# Patient Record
Sex: Female | Born: 1955
Health system: Southern US, Community
[De-identification: ages and names within clinical notes are randomized; demographics above are authoritative.]

## PROBLEM LIST (undated history)

## (undated) ENCOUNTER — Ambulatory Visit (HOSPITAL_COMMUNITY): Discharge status: Absent without leave | Payer: Commercial Managed Care - PPO

## (undated) ENCOUNTER — Ambulatory Visit (HOSPITAL_COMMUNITY): Payer: PPO | Source: Home / Self Care

## (undated) DIAGNOSIS — A599 Trichomoniasis, unspecified: Secondary | ICD-10-CM

## (undated) DIAGNOSIS — J449 Chronic obstructive pulmonary disease, unspecified: Secondary | ICD-10-CM

## (undated) HISTORY — PX: TUBAL LIGATION: SHX77

---

## 2000-08-16 ENCOUNTER — Other Ambulatory Visit: Admission: RE | Admit: 2000-08-16 | Discharge: 2000-08-16 | Payer: Self-pay | Admitting: Family Medicine

## 2001-03-26 ENCOUNTER — Emergency Department (HOSPITAL_COMMUNITY): Admission: EM | Admit: 2001-03-26 | Discharge: 2001-03-26 | Payer: Self-pay | Admitting: Emergency Medicine

## 2004-09-23 ENCOUNTER — Inpatient Hospital Stay (HOSPITAL_COMMUNITY): Admission: AD | Admit: 2004-09-23 | Discharge: 2004-09-23 | Payer: Self-pay | Admitting: Obstetrics & Gynecology

## 2005-07-31 ENCOUNTER — Emergency Department (HOSPITAL_COMMUNITY): Admission: EM | Admit: 2005-07-31 | Discharge: 2005-07-31 | Payer: Self-pay | Admitting: Emergency Medicine

## 2009-02-10 ENCOUNTER — Emergency Department (HOSPITAL_COMMUNITY): Admission: EM | Admit: 2009-02-10 | Discharge: 2009-02-10 | Payer: Self-pay | Admitting: Emergency Medicine

## 2009-10-31 ENCOUNTER — Emergency Department (HOSPITAL_COMMUNITY): Admission: EM | Admit: 2009-10-31 | Discharge: 2009-10-31 | Payer: Self-pay | Admitting: Family Medicine

## 2010-11-21 ENCOUNTER — Emergency Department (HOSPITAL_COMMUNITY)
Admission: EM | Admit: 2010-11-21 | Discharge: 2010-11-21 | Payer: Self-pay | Source: Home / Self Care | Admitting: Emergency Medicine

## 2011-01-01 ENCOUNTER — Encounter: Payer: Self-pay | Admitting: Obstetrics

## 2011-04-24 ENCOUNTER — Inpatient Hospital Stay (INDEPENDENT_AMBULATORY_CARE_PROVIDER_SITE_OTHER)
Admission: RE | Admit: 2011-04-24 | Discharge: 2011-04-24 | Disposition: A | Discharge status: Home / Self Care | Payer: Self-pay | Source: Ambulatory Visit | Attending: Family Medicine | Admitting: Family Medicine

## 2011-04-24 DIAGNOSIS — B009 Herpesviral infection, unspecified: Secondary | ICD-10-CM

## 2011-04-24 DIAGNOSIS — K029 Dental caries, unspecified: Secondary | ICD-10-CM

## 2012-11-26 ENCOUNTER — Encounter (HOSPITAL_COMMUNITY): Payer: Self-pay | Admitting: Emergency Medicine

## 2012-11-26 ENCOUNTER — Emergency Department (HOSPITAL_COMMUNITY)
Admission: EM | Admit: 2012-11-26 | Discharge: 2012-11-26 | Disposition: A | Discharge status: Home / Self Care | Payer: No Typology Code available for payment source | Attending: Emergency Medicine | Admitting: Emergency Medicine

## 2012-11-26 DIAGNOSIS — H60399 Other infective otitis externa, unspecified ear: Secondary | ICD-10-CM | POA: Insufficient documentation

## 2012-11-26 DIAGNOSIS — Y929 Unspecified place or not applicable: Secondary | ICD-10-CM | POA: Insufficient documentation

## 2012-11-26 DIAGNOSIS — Y92410 Unspecified street and highway as the place of occurrence of the external cause: Secondary | ICD-10-CM | POA: Insufficient documentation

## 2012-11-26 DIAGNOSIS — H6090 Unspecified otitis externa, unspecified ear: Secondary | ICD-10-CM

## 2012-11-26 DIAGNOSIS — Z23 Encounter for immunization: Secondary | ICD-10-CM | POA: Insufficient documentation

## 2012-11-26 DIAGNOSIS — Y939 Activity, unspecified: Secondary | ICD-10-CM | POA: Insufficient documentation

## 2012-11-26 DIAGNOSIS — W268XXA Contact with other sharp object(s), not elsewhere classified, initial encounter: Secondary | ICD-10-CM | POA: Insufficient documentation

## 2012-11-26 DIAGNOSIS — F17200 Nicotine dependence, unspecified, uncomplicated: Secondary | ICD-10-CM | POA: Insufficient documentation

## 2012-11-26 DIAGNOSIS — R51 Headache: Secondary | ICD-10-CM

## 2012-11-26 DIAGNOSIS — Y9389 Activity, other specified: Secondary | ICD-10-CM | POA: Insufficient documentation

## 2012-11-26 DIAGNOSIS — G44309 Post-traumatic headache, unspecified, not intractable: Secondary | ICD-10-CM | POA: Insufficient documentation

## 2012-11-26 MED ORDER — TETANUS-DIPHTH-ACELL PERTUSSIS 5-2.5-18.5 LF-MCG/0.5 IM SUSP
0.5000 mL | Freq: Once | INTRAMUSCULAR | Status: AC
  Administered 2012-11-26: 0.5 mL via INTRAMUSCULAR
  Filled 2012-11-26: qty 0.5

## 2012-11-26 MED ORDER — OFLOXACIN 0.3 % OT SOLN: 5.0000 [drp] | Freq: Two times a day (BID) | OTIC | Status: DC

## 2012-11-26 MED ORDER — IBUPROFEN 800 MG PO TABS: 800.0000 mg | ORAL_TABLET | Freq: Three times a day (TID) | ORAL | Status: DC

## 2012-11-26 MED ORDER — OFLOXACIN 0.3 % OT SOLN
5.0000 [drp] | Freq: Every day | OTIC | Status: DC
  Administered 2012-11-26: 5 [drp] via OTIC
  Filled 2012-11-26: qty 5
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
## 2012-11-26 NOTE — ED Notes (Signed)
Pt c/o earache that started today.  Pain 10/10.

## 2012-11-27 NOTE — ED Provider Notes (Signed)
Medical screening examination/treatment/procedure(s) were performed by non-physician practitioner and as supervising physician I was immediately available for consultation/collaboration.  Tersa Fotopoulos, MD 11/27/12 0002 

## 2012-12-18 ENCOUNTER — Emergency Department (HOSPITAL_COMMUNITY)
Admission: EM | Admit: 2012-12-18 | Discharge: 2012-12-18 | Disposition: A | Discharge status: Home / Self Care | Payer: PRIVATE HEALTH INSURANCE | Attending: Emergency Medicine | Admitting: Emergency Medicine

## 2012-12-18 ENCOUNTER — Encounter (HOSPITAL_COMMUNITY): Payer: Self-pay | Admitting: Emergency Medicine

## 2012-12-18 DIAGNOSIS — R229 Localized swelling, mass and lump, unspecified: Secondary | ICD-10-CM | POA: Insufficient documentation

## 2012-12-18 DIAGNOSIS — F17200 Nicotine dependence, unspecified, uncomplicated: Secondary | ICD-10-CM | POA: Insufficient documentation

## 2012-12-18 DIAGNOSIS — K047 Periapical abscess without sinus: Secondary | ICD-10-CM | POA: Insufficient documentation

## 2012-12-18 MED ORDER — HYDROCODONE-ACETAMINOPHEN 5-325 MG PO TABS: 1.0000 | ORAL_TABLET | Freq: Four times a day (QID) | ORAL | Status: DC | PRN

## 2012-12-18 MED ORDER — PENICILLIN V POTASSIUM 500 MG PO TABS: 500.0000 mg | ORAL_TABLET | Freq: Four times a day (QID) | ORAL | Status: DC

## 2012-12-18 NOTE — ED Provider Notes (Signed)
History     CSN: [id number]  Arrival date & time 12/18/12  1014   First MD Initiated Contact with Patient 12/18/12 1044      Chief Complaint  Patient presents with  . Dental Pain  . Facial Swelling    swelling x 2 days    (Consider location/radiation/quality/duration/timing/severity/associated sxs/prior treatment) HPI The patient presents to the emergency department with dental abscess.  Patient states that she has taken Tylenol and Advil for pain without significant relief.  Patient also states that she has been gargling with peroxide and water.  Patient, states she's had cracked teeth.  On the left lower lower part of her jaw for quite a while.  Patient denies fevers, nausea, vomiting, difficulty breathing, difficulty swallowing, or swelling in her tongue.  Patient states that she's also not experiencing any neck pain.  History reviewed. No pertinent past medical history.  Past Surgical History  Procedure Date  . Cesarean section     Family History  Problem Relation Age of Onset  . Diabetes Mother   . Diabetes Sister   . Hypertension Sister     History  Substance Use Topics  . Smoking status: Current Every Day Smoker -- 0.5 packs/day  . Smokeless tobacco: Not on file  . Alcohol Use: No    OB History    Grav Para Term Preterm Abortions TAB SAB Ect Mult Living                  Review of Systems All other systems negative except as documented in the HPI. All pertinent positives and negatives as reviewed in the HPI. Allergies  Review of patient's allergies indicates no known allergies.  Home Medications   Current Outpatient Rx  Name  Route  Sig  Dispense  Refill  . ASPIRIN 325 MG PO TABS   Oral   Take 325 mg by mouth daily.           BP 120/64  Pulse 87  Temp 98.6 F (37 C) (Oral)  Resp 18  SpO2 99%  Physical Exam  Constitutional: She is oriented to person, place, and time. She appears well-developed and well-nourished.  HENT:  Head: Normocephalic  and atraumatic.  Mouth/Throat: Oropharynx is clear and moist.    Neck: Trachea normal and normal range of motion. Neck supple. No rigidity. No edema, no erythema and normal range of motion present.    Cardiovascular: Normal rate and regular rhythm.   Pulmonary/Chest: Effort normal.  Neurological: She is alert and oriented to person, place, and time.  Skin: Skin is warm and dry. No rash noted.    ED Course  Procedures (including critical care time)  Patient be treated for a dental abscess and referred to dentistry.  Patient is advised to return here for any worsening in her condition.   MDM          Carlyle Dolly, PA-C 12/18/12 1102

## 2012-12-18 NOTE — ED Notes (Signed)
Pt reports 2 day hx of swelling and pain in l/lower jaw.

## 2012-12-18 NOTE — ED Provider Notes (Signed)
Medical screening examination/treatment/procedure(s) were performed by non-physician practitioner and as supervising physician I was immediately available for consultation/collaboration.    Nelia Shi, MD 12/18/12 1104

## 2013-01-28 ENCOUNTER — Emergency Department (HOSPITAL_COMMUNITY)
Admission: EM | Admit: 2013-01-28 | Discharge: 2013-01-29 | Disposition: A | Discharge status: Home / Self Care | Payer: PRIVATE HEALTH INSURANCE | Attending: Emergency Medicine | Admitting: Emergency Medicine

## 2013-01-28 ENCOUNTER — Emergency Department (HOSPITAL_COMMUNITY): Payer: PRIVATE HEALTH INSURANCE

## 2013-01-28 ENCOUNTER — Encounter (HOSPITAL_COMMUNITY): Payer: Self-pay | Admitting: *Deleted

## 2013-01-28 DIAGNOSIS — R079 Chest pain, unspecified: Secondary | ICD-10-CM | POA: Insufficient documentation

## 2013-01-28 DIAGNOSIS — R5381 Other malaise: Secondary | ICD-10-CM | POA: Insufficient documentation

## 2013-01-28 DIAGNOSIS — R05 Cough: Secondary | ICD-10-CM | POA: Insufficient documentation

## 2013-01-28 DIAGNOSIS — Z7982 Long term (current) use of aspirin: Secondary | ICD-10-CM | POA: Insufficient documentation

## 2013-01-28 DIAGNOSIS — R059 Cough, unspecified: Secondary | ICD-10-CM | POA: Insufficient documentation

## 2013-01-28 DIAGNOSIS — J40 Bronchitis, not specified as acute or chronic: Secondary | ICD-10-CM

## 2013-01-28 DIAGNOSIS — J209 Acute bronchitis, unspecified: Secondary | ICD-10-CM | POA: Insufficient documentation

## 2013-01-28 DIAGNOSIS — F17200 Nicotine dependence, unspecified, uncomplicated: Secondary | ICD-10-CM | POA: Insufficient documentation

## 2013-01-28 LAB — CBC WITH DIFFERENTIAL/PLATELET
Eosinophils Relative: 3 % (ref 0–5)
HCT: 39.8 % (ref 36.0–46.0)
Lymphocytes Relative: 25 % (ref 12–46)
Lymphs Abs: 3.1 10*3/uL (ref 0.7–4.0)
MCV: 86.3 fL (ref 78.0–100.0)
Monocytes Absolute: 0.7 10*3/uL (ref 0.1–1.0)
Monocytes Relative: 5 % (ref 3–12)
RDW: 13.5 % (ref 11.5–15.5)
WBC: 12.7 10*3/uL — ABNORMAL HIGH (ref 4.0–10.5)

## 2013-01-28 LAB — COMPREHENSIVE METABOLIC PANEL
BUN: 14 mg/dL (ref 6–23)
CO2: 25 mEq/L (ref 19–32)
Calcium: 8.5 mg/dL (ref 8.4–10.5)
Creatinine, Ser: 0.74 mg/dL (ref 0.50–1.10)
GFR calc Af Amer: >90 mL/min (ref >90)
GFR calc non Af Amer: >90 mL/min (ref >90)
Glucose, Bld: 91 mg/dL (ref 70–99)

## 2013-01-28 LAB — POCT I-STAT TROPONIN I: Troponin i, poc: 0 ng/mL (ref 0.00–0.08)

## 2013-01-28 MED ORDER — ASPIRIN 81 MG PO CHEW
324.0000 mg | CHEWABLE_TABLET | Freq: Once | ORAL | Status: AC
  Administered 2013-01-28: 324 mg via ORAL
  Filled 2013-01-28: qty 4

## 2013-01-28 NOTE — ED Provider Notes (Signed)
History     CSN: [id number]  Arrival date & time 01/28/13  2012   First MD Initiated Contact with Patient 01/28/13 2125      Chief Complaint  Patient presents with  . Chest Pain   HPI  History provided by the patient. Patient is a 57 year old African American female with no significant PMH who presents with complaints of left chest wall pain and soreness for the past 2 days. Pain came on gradually and has been constant for the past 2 days without worsening or improvement. Pain does not change with asked to the bite is slightly worse with certain movements and position. Pain is not pleuritic. Patient does report having a chronic cough that she states is from her daily smoking. Cough is productive but has not changed significantly. Symptoms are also associated with slight fatigue. She denies any associated fever, chills, sweats, nausea, diaphoresis, hemoptysis, heart palpitations or shortness of breath. Patient has not used any medications or other treatments for symptoms. Denies any injury or trauma. Denies having similar symptoms previously. Patient does have a brother who passed away from sudden heart attack in his late 69s recently but denies any other family history of CAD. Patient is not followed regularly by PCP and has no prior history of hypertension, hypercholesterolemia or diabetes.    History reviewed. No pertinent past medical history.  Past Surgical History  Procedure Laterality Date  . Cesarean section      Family History  Problem Relation Age of Onset  . Diabetes Mother   . Diabetes Sister   . Hypertension Sister     History  Substance Use Topics  . Smoking status: Current Every Day Smoker -- 0.50 packs/day  . Smokeless tobacco: Not on file  . Alcohol Use: No    OB History   Grav Para Term Preterm Abortions TAB SAB Ect Mult Living                  Review of Systems  Constitutional: Negative for fever, diaphoresis and appetite change.  Respiratory: Positive  for cough. Negative for shortness of breath.   Cardiovascular: Positive for chest pain. Negative for palpitations and leg swelling.  Gastrointestinal: Negative for nausea, vomiting and abdominal pain.  Neurological: Negative for dizziness, light-headedness and headaches.  All other systems reviewed and are negative.    Allergies  Review of patient's allergies indicates no known allergies.  Home Medications   Current Outpatient Rx  Name  Route  Sig  Dispense  Refill  . aspirin 325 MG tablet   Oral   Take 325 mg by mouth daily.         . penicillin v potassium (VEETID) 500 MG tablet   Oral   Take 1 tablet (500 mg total) by mouth 4 (four) times daily.   28 tablet   0     BP 125/64  Pulse 89  Temp(Src) 98.1 F (36.7 C)  Resp 20  SpO2 99%  Physical Exam  Nursing note and vitals reviewed. Constitutional: She is oriented to person, place, and time. She appears well-developed and well-nourished. No distress.  HENT:  Head: Normocephalic and atraumatic.  Eyes: Conjunctivae are normal.  Cardiovascular: Normal rate and regular rhythm.   No murmur heard. Pulmonary/Chest: Effort normal and breath sounds normal. No respiratory distress. She has no wheezes. She has no rales.    Tenderness over left lateral chest wall or ribs. No deformities or step-offs. No crepitus. No skin changes or rash.  Abdominal: Soft.  There is no tenderness. There is no rebound and no guarding.  Musculoskeletal: Normal range of motion. She exhibits no edema and no tenderness.  No clinical signs concerning for DVT  Neurological: She is alert and oriented to person, place, and time.  Skin: Skin is warm and dry. No rash noted.  Psychiatric: She has a normal mood and affect. Her behavior is normal.    ED Course  Procedures   Results for orders placed during the hospital encounter of 01/28/13  CBC WITH DIFFERENTIAL      Result Value Range   WBC 12.7 (*) 4.0 - 10.5 K/uL   RBC 4.61  3.87 - 5.11 MIL/uL     Hemoglobin 13.4  12.0 - 15.0 g/dL   HCT 29.5  28.4 - 13.2 %   MCV 86.3  78.0 - 100.0 fL   MCH 29.1  26.0 - 34.0 pg   MCHC 33.7  30.0 - 36.0 g/dL   RDW 44.0  10.2 - 72.5 %   Platelets 340  150 - 400 K/uL   Neutrophils Relative 67  43 - 77 %   Neutro Abs 8.6 (*) 1.7 - 7.7 K/uL   Lymphocytes Relative 25  12 - 46 %   Lymphs Abs 3.1  0.7 - 4.0 K/uL   Monocytes Relative 5  3 - 12 %   Monocytes Absolute 0.7  0.1 - 1.0 K/uL   Eosinophils Relative 3  0 - 5 %   Eosinophils Absolute 0.3  0.0 - 0.7 K/uL   Basophils Relative 0  0 - 1 %   Basophils Absolute 0.0  0.0 - 0.1 K/uL  COMPREHENSIVE METABOLIC PANEL      Result Value Range   Sodium 139  135 - 145 mEq/L   Potassium 3.4 (*) 3.5 - 5.1 mEq/L   Chloride 105  96 - 112 mEq/L   CO2 25  19 - 32 mEq/L   Glucose, Bld 91  70 - 99 mg/dL   BUN 14  6 - 23 mg/dL   Creatinine, Ser 3.66  0.50 - 1.10 mg/dL   Calcium 8.5  8.4 - 44.0 mg/dL   Total Protein 6.9  6.0 - 8.3 g/dL   Albumin 3.3 (*) 3.5 - 5.2 g/dL   AST 13  0 - 37 U/L   ALT 10  0 - 35 U/L   Alkaline Phosphatase 138 (*) 39 - 117 U/L   Total Bilirubin 0.2 (*) 0.3 - 1.2 mg/dL   GFR calc non Af Amer >90  >90 mL/min   GFR calc Af Amer >90  >90 mL/min  POCT I-STAT TROPONIN I      Result Value Range   Troponin i, poc 0.00  0.00 - 0.08 ng/mL   Comment 3                Dg Chest 2 View  01/28/2013  *RADIOLOGY REPORT*  Clinical Data: Chest pain  CHEST - 2 VIEW  Comparison: Prior chest x-ray 11/21/2010  Findings: Mild residual scarring in the left lower lobe at the site of the prior pneumonia.  Biapical pleural parenchymal scarring is similar to prior.  No new airspace consolidation, edema, pleural effusion or pneumothorax.  Stable background changes of COPD and emphysema.  Cardiac and mediastinal contours remain within normal limits.  No acute osseous abnormality.  IMPRESSION:  1.  No acute cardiopulmonary disease. 2.  Stable background changes of COPD, emphysema, biapical and left basilar  scarring.   Original Report Authenticated By: Vilma Prader  Archer Asa, M.D.      1. Bronchitis   2. Chest pain       MDM  9:20 PM patient seen and evaluated. Patient lying comfortably in bed does not appear in any acute distress.  Patient seen and discussed with attending physician. Patient's symptoms atypical with some reproduced pain to palpation over lateral chest wall. Patient with negative troponin, unremarkable EKG and chest x-ray. Doubt ACS. Patient without respiratory complaints and doubt PE. Patient felt stable for discharge     Date: 01/28/2013  Rate: 82  Rhythm: normal sinus rhythm  QRS Axis: normal  Intervals: normal  ST/T Wave abnormalities: nonspecific T wave changes  Conduction Disutrbances:none  Narrative Interpretation:   Old EKG Reviewed: Nonspecific flattening of T waves in V2 otherwise no significant changes from October 31, 2009    Phill Mutter [place], [place] 01/29/13 2009

## 2013-01-28 NOTE — ED Notes (Signed)
Pt c/o chest tightness x 2 days; comes and goes; increased pain with movement; also c/o left side pain; denies n/v; denies diaphoresis; feels lazy

## 2013-01-29 MED ORDER — AZITHROMYCIN 250 MG PO TABS: 250.0000 mg | ORAL_TABLET | Freq: Every day | ORAL | Status: DC

## 2013-01-29 NOTE — ED Provider Notes (Signed)
Felicia Lawson is a 57 y.o. female here for evaluation of chest tightness, for 2 days. The tenderness is centered at and occurs without known provocation. It does not radiate. It lasts between seconds to minutes. She has a mild nonproductive cough. She denies shortness of breath.  Exam alert, calm, cooperative. Vital signs are normal. There is no respiratory distress. No chest wall pain to palpation   ED evaluation is negative. Troponin negative x2.  Assessment nonspecific chest pain, possible related to bronchitis. She is low risk for cardiac chest pain. She is stable for discharge.   She is counseled about mechanisms for smoking cessation   Medical screening examination/treatment/procedure(s) were conducted as a shared visit with non-physician practitioner(s) and myself.  I personally evaluated the patient during the encounter  Flint Melter, MD 01/30/13 [facility]ers

## 2013-01-29 NOTE — ED Notes (Signed)
Patient is alert and oriented x3.  She was given DC instructions and follow up visit instructions.  Patient gave verbal understanding. She was DC ambulatory under her own power to home.  V/S stable.  He was not showing any signs of distress on DC 

## 2013-02-21 ENCOUNTER — Emergency Department (HOSPITAL_COMMUNITY)
Admission: EM | Admit: 2013-02-21 | Discharge: 2013-02-21 | Disposition: A | Discharge status: Home / Self Care | Payer: Self-pay | Attending: Emergency Medicine | Admitting: Emergency Medicine

## 2013-02-21 ENCOUNTER — Encounter (HOSPITAL_COMMUNITY): Payer: Self-pay | Admitting: Emergency Medicine

## 2013-02-21 DIAGNOSIS — S0086XA Insect bite (nonvenomous) of other part of head, initial encounter: Secondary | ICD-10-CM

## 2013-02-21 DIAGNOSIS — Z7982 Long term (current) use of aspirin: Secondary | ICD-10-CM | POA: Insufficient documentation

## 2013-02-21 DIAGNOSIS — S60562A Insect bite (nonvenomous) of left hand, initial encounter: Secondary | ICD-10-CM

## 2013-02-21 DIAGNOSIS — F17200 Nicotine dependence, unspecified, uncomplicated: Secondary | ICD-10-CM | POA: Insufficient documentation

## 2013-02-21 DIAGNOSIS — Y939 Activity, unspecified: Secondary | ICD-10-CM | POA: Insufficient documentation

## 2013-02-21 DIAGNOSIS — W57XXXA Bitten or stung by nonvenomous insect and other nonvenomous arthropods, initial encounter: Secondary | ICD-10-CM | POA: Insufficient documentation

## 2013-02-21 DIAGNOSIS — S1096XA Insect bite of unspecified part of neck, initial encounter: Secondary | ICD-10-CM | POA: Insufficient documentation

## 2013-02-21 DIAGNOSIS — Y9289 Other specified places as the place of occurrence of the external cause: Secondary | ICD-10-CM | POA: Insufficient documentation

## 2013-02-21 DIAGNOSIS — S60569A Insect bite (nonvenomous) of unspecified hand, initial encounter: Secondary | ICD-10-CM | POA: Insufficient documentation

## 2013-02-21 MED ORDER — DIPHENHYDRAMINE HCL 25 MG PO TABS: 25.0000 mg | ORAL_TABLET | Freq: Four times a day (QID) | ORAL | Status: DC

## 2013-02-21 MED ORDER — PREDNISONE 50 MG PO TABS: 50.0000 mg | ORAL_TABLET | Freq: Every day | ORAL | Status: DC

## 2013-02-21 MED ORDER — FAMOTIDINE 20 MG PO TABS
20.0000 mg | ORAL_TABLET | Freq: Once | ORAL | Status: AC
  Administered 2013-02-21: 20 mg via ORAL
  Filled 2013-02-21: qty 1

## 2013-02-21 MED ORDER — METHYLPREDNISOLONE SODIUM SUCC 125 MG IJ SOLR
125.0000 mg | Freq: Once | INTRAMUSCULAR | Status: AC
  Administered 2013-02-21: 125 mg via INTRAVENOUS
  Filled 2013-02-21: qty 2

## 2013-02-21 MED ORDER — DIPHENHYDRAMINE HCL 25 MG PO CAPS
25.0000 mg | ORAL_CAPSULE | Freq: Once | ORAL | Status: AC
  Administered 2013-02-21: 25 mg via ORAL
  Filled 2013-02-21: qty 1

## 2013-02-21 NOTE — ED Provider Notes (Signed)
History     CSN: [id number]  Arrival date & time 02/21/13  1549   First MD Initiated Contact with Patient 02/21/13 1558      Chief Complaint  Patient presents with  . Allergic Reaction    (Consider location/radiation/quality/duration/timing/severity/associated sxs/prior treatment) HPI Pt awoke this AM with L dorsum of hand itching and swelling and noticed L periorbital swelling and itching when she got out of bed. She has multiple possible bites at both site. No intraoral swelling or difficulty breathing. Pt has had similar reaction to bee stings in the past. No new foods or recent medication changes.  History reviewed. No pertinent past medical history.  Past Surgical History  Procedure Laterality Date  . Cesarean section      Family History  Problem Relation Age of Onset  . Diabetes Mother   . Diabetes Sister   . Hypertension Sister     History  Substance Use Topics  . Smoking status: Current Every Day Smoker -- 0.50 packs/day  . Smokeless tobacco: Not on file  . Alcohol Use: No    OB History   Grav Para Term Preterm Abortions TAB SAB Ect Mult Living                  Review of Systems  Constitutional: Negative for fever and chills.  HENT: Positive for facial swelling. Negative for mouth sores, trouble swallowing and voice change.   Eyes: Negative for visual disturbance.  Respiratory: Negative for shortness of breath, wheezing and stridor.   Skin: Positive for rash and wound.  All other systems reviewed and are negative.    Allergies  Review of patient's allergies indicates no known allergies.  Home Medications   Current Outpatient Rx  Name  Route  Sig  Dispense  Refill  . aspirin 325 MG tablet   Oral   Take 325 mg by mouth daily.         . diphenhydrAMINE (BENADRYL) 25 mg capsule   Oral   Take 25 mg by mouth once.         . diphenhydrAMINE (BENADRYL) 25 MG tablet   Oral   Take 1 tablet (25 mg total) by mouth every 6 (six) hours.   20  tablet   0   . predniSONE (DELTASONE) 50 MG tablet   Oral   Take 1 tablet (50 mg total) by mouth daily.   5 tablet   0     BP 126/62  Pulse 75  Temp(Src) 98.6 F (37 C) (Oral)  Resp 20  SpO2 99%  Physical Exam  Nursing note and vitals reviewed. Constitutional: She is oriented to person, place, and time. She appears well-developed and well-nourished. No distress.  HENT:  Head: Normocephalic and atraumatic.  Mouth/Throat: Oropharynx is clear and moist.  No tongue, lip or posterior pharynx swelling.   +L periorbital swelling with several possible insect bites or stings at site and on L forehead  Eyes: EOM are normal. Pupils are equal, round, and reactive to light.  Neck: Normal range of motion. Neck supple.  Pulmonary/Chest: Effort normal and breath sounds normal. No stridor. She has no wheezes.  Abdominal: Soft. Bowel sounds are normal.  Musculoskeletal: Normal range of motion. She exhibits edema (L dorsum of hand swelling surrounding papule consistent with insect sting or bite. No residual stinger). She exhibits no tenderness.  Neurological: She is alert and oriented to person, place, and time.  Skin: Skin is warm and dry. No rash noted. No erythema.  Psychiatric: She has a normal mood and affect. Her behavior is normal.    ED Course  Procedures (including critical care time)  Labs Reviewed - No data to display No results found.   1. Insect bite of face with local reaction, initial encounter   2. Insect bite of hand with local reaction, left, initial encounter       MDM  Local reaction to insect sting or bite. No airway compromise. Will treat with steroid and antihistamines.    Mild improvement of swelling in ED. No airway compromise. D/c home.      Loren Racer, MD 02/21/13 [phone]

## 2013-02-21 NOTE — ED Notes (Signed)
Pt presenting to ed with c/o possible allergic reaction or bite to her left hand. Pt with obvious swelling noted to her left eye and left hand. Pt states swelling is getting worse. Pt denies itching at this time

## 2013-02-21 NOTE — ED Notes (Signed)
Pharmacy tech at bedside 

## 2014-04-15 ENCOUNTER — Encounter (HOSPITAL_COMMUNITY): Payer: Self-pay | Admitting: Emergency Medicine

## 2014-04-15 ENCOUNTER — Emergency Department (HOSPITAL_COMMUNITY)
Admission: EM | Admit: 2014-04-15 | Discharge: 2014-04-15 | Disposition: A | Discharge status: Home / Self Care | Payer: No Typology Code available for payment source | Attending: Emergency Medicine | Admitting: Emergency Medicine

## 2014-04-15 ENCOUNTER — Emergency Department (HOSPITAL_COMMUNITY): Payer: No Typology Code available for payment source

## 2014-04-15 DIAGNOSIS — J209 Acute bronchitis, unspecified: Secondary | ICD-10-CM | POA: Insufficient documentation

## 2014-04-15 DIAGNOSIS — R634 Abnormal weight loss: Secondary | ICD-10-CM

## 2014-04-15 DIAGNOSIS — Z7982 Long term (current) use of aspirin: Secondary | ICD-10-CM | POA: Insufficient documentation

## 2014-04-15 DIAGNOSIS — F17200 Nicotine dependence, unspecified, uncomplicated: Secondary | ICD-10-CM | POA: Insufficient documentation

## 2014-04-15 DIAGNOSIS — IMO0002 Reserved for concepts with insufficient information to code with codable children: Secondary | ICD-10-CM | POA: Insufficient documentation

## 2014-04-15 DIAGNOSIS — Z79899 Other long term (current) drug therapy: Secondary | ICD-10-CM | POA: Insufficient documentation

## 2014-04-15 DIAGNOSIS — J40 Bronchitis, not specified as acute or chronic: Secondary | ICD-10-CM

## 2014-04-15 LAB — URINE MICROSCOPIC-ADD ON

## 2014-04-15 LAB — URINALYSIS, ROUTINE W REFLEX MICROSCOPIC
Bilirubin Urine: NEGATIVE
Glucose, UA: NEGATIVE mg/dL
Ketones, ur: NEGATIVE mg/dL
LEUKOCYTES UA: NEGATIVE
NITRITE: NEGATIVE
PROTEIN: NEGATIVE mg/dL
Specific Gravity, Urine: 1.015 (ref 1.005–1.030)
UROBILINOGEN UA: 0.2 mg/dL (ref 0.0–1.0)
pH: 6.5 (ref 5.0–8.0)

## 2014-04-15 LAB — COMPREHENSIVE METABOLIC PANEL
ALBUMIN: 3.4 g/dL — AB (ref 3.5–5.2)
ALK PHOS: 121 U/L — AB (ref 39–117)
ALT: 12 U/L (ref 0–35)
AST: 16 U/L (ref 0–37)
BUN: 12 mg/dL (ref 6–23)
CALCIUM: 8.7 mg/dL (ref 8.4–10.5)
CO2: 25 mEq/L (ref 19–32)
Chloride: 103 mEq/L (ref 96–112)
Creatinine, Ser: 0.7 mg/dL (ref 0.50–1.10)
GFR calc Af Amer: >90 mL/min (ref >90)
GFR calc non Af Amer: >90 mL/min (ref >90)
GLUCOSE: 95 mg/dL (ref 70–99)
Potassium: 3.9 mEq/L (ref 3.7–5.3)
Sodium: 138 mEq/L (ref 137–147)
Total Bilirubin: 0.4 mg/dL (ref 0.3–1.2)
Total Protein: 6.8 g/dL (ref 6.0–8.3)

## 2014-04-15 LAB — T4, FREE: Free T4: 0.94 ng/dL (ref 0.80–1.80)

## 2014-04-15 LAB — CBG MONITORING, ED: GLUCOSE-CAPILLARY: 67 mg/dL — AB (ref 70–99)

## 2014-04-15 LAB — CBC WITH DIFFERENTIAL/PLATELET
BASOS ABS: 0 10*3/uL (ref 0.0–0.1)
Basophils Relative: 0 % (ref 0–1)
EOS PCT: 0 % (ref 0–5)
Eosinophils Absolute: 0 10*3/uL (ref 0.0–0.7)
HCT: 40.4 % (ref 36.0–46.0)
Hemoglobin: 13.6 g/dL (ref 12.0–15.0)
Lymphocytes Relative: 27 % (ref 12–46)
Lymphs Abs: 3.1 10*3/uL (ref 0.7–4.0)
MCH: 29.2 pg (ref 26.0–34.0)
MCHC: 33.7 g/dL (ref 30.0–36.0)
MCV: 86.7 fL (ref 78.0–100.0)
MONO ABS: 0.7 10*3/uL (ref 0.1–1.0)
Monocytes Relative: 6 % (ref 3–12)
Neutro Abs: 7.5 10*3/uL (ref 1.7–7.7)
Neutrophils Relative %: 66 % (ref 43–77)
PLATELETS: 275 10*3/uL (ref 150–400)
RBC: 4.66 MIL/uL (ref 3.87–5.11)
RDW: 13.7 % (ref 11.5–15.5)
WBC: 11.4 10*3/uL — ABNORMAL HIGH (ref 4.0–10.5)

## 2014-04-15 LAB — RAPID HIV SCREEN (WH-MAU): SUDS RAPID HIV SCREEN: NONREACTIVE

## 2014-04-15 LAB — TSH: TSH: 2.29 u[IU]/mL (ref 0.350–4.500)

## 2014-04-15 MED ORDER — AMOXICILLIN 500 MG PO CAPS: 500.0000 mg | ORAL_CAPSULE | Freq: Three times a day (TID) | ORAL | Status: DC

## 2014-04-15 NOTE — ED Notes (Addendum)
Pt states that x 2 wks, pt has had nagging cough, night sweats and weight loss.  Pt states she went from a size 8 to a size 4 in 2 wks.  Pt works in a day care.

## 2014-04-15 NOTE — Discharge Instructions (Signed)
Drink plenty of fluids. Take the antibiotics until gone. Look at the resource guide to get a primary care doctor. Your thyroid tests and TB blood tests are still pending. Your other tests were normal. The reason for your night sweats is not clear at this time. You need to get a primary care doctor to further pursue your evaluation.    Emergency Department Resource Guide 1) Find a Doctor and Pay Out of Pocket Although you won't have to find out who is covered by your insurance plan, it is a good idea to ask around and get recommendations. You will then need to call the office and see if the doctor you have chosen will accept you as a new patient and what types of options they offer for patients who are self-pay. Some doctors offer discounts or will set up payment plans for their patients who do not have insurance, but you will need to ask so you aren't surprised when you get to your appointment.  2) Contact Your Local Health Department Not all health departments have doctors that can see patients for sick visits, but many do, so it is worth a call to see if yours does. If you don't know where your local health department is, you can check in your phone book. The CDC also has a tool to help you locate your state's health department, and many state websites also have listings of all of their local health departments.  3) Find a Walk-in Clinic If your illness is not likely to be very severe or complicated, you may want to try a walk in clinic. These are popping up all over the country in pharmacies, drugstores, and shopping centers. They're usually staffed by nurse practitioners or physician assistants that have been trained to treat common illnesses and complaints. They're usually fairly quick and inexpensive. However, if you have serious medical issues or chronic medical problems, these are probably not your best option.  No Primary Care Doctor: - Call Health Connect at  [phone] - they can help you  locate a primary care doctor that  accepts your insurance, provides certain services, etc. - Physician Referral Service- [phone]  Chronic Pain Problems: Organization         Address  Phone   Notes  [facility]  [phone]) [phone] Patients need to be referred by their primary care doctor.   Medication Assistance: Organization         Address  Phone   Notes  [facility] [address] [place], [place] [address] [phone]) [phone] --Must be a resident of [facility] -- Must have NO insurance coverage whatsoever (no Medicaid/ Medicare, etc.) -- The pt. MUST have a primary care doctor that directs their care regularly and follows them in the community   MedAssist  [phone]) [phone]   [employer]  [phone]) [phone]    Agencies that provide inexpensive medical care: Organization         Address  Phone   Notes  [facility]  [phone]   [facility]    [phone]   [facility] [address] [place], [place] [address] [phone]) [phone]   [facility] [address], [place] [phone]) [phone]   [facility]anned Parenthood    [phone](336) [phone]   [facility]    [phone]   [facility]  [address], [place] Phone:  (  [phone], Fax:  [phone] Hours of Operation:  9 am - 6 pm, M-F.  Also accepts Medicaid/Medicare and self-pay.  [facility] for [place], [address], [place] Phone: [phone], Fax: [phone]. Hours of Operation:  8:30 am - 5:30 pm, M-F.  Also accepts Medicaid and self-pay.  [facility] [place] [address] [place] Phone: [phone]   [place], [place], [place] [phone], Ext. [phone] Mondays & Thursdays: 7-9 AM.  First 15 patients are seen on a first come, first serve basis.    [place]  Providers:  Organization         Address  Phone   Notes  [facility] [address], [place] [phone] Also accepts self-pay patients.  [facility] [address], [place]  [phone]   [place], [address], [place] [phone]   [facility] [address], [place] [phone]   Lucianne Lei [address], [place]   [phone] Only accepts [place] Access [place] patients after they have their name applied to their card.   Self-Pay (no insurance) in [facility]:  Organization         Address  Phone   Notes  Sickle Cell Patients, [facility] [place] [phone]   [facility] [place] [phone]   [facility] [place]  [place], [place], [place] [phone]   [facility]/Dr. Osei-Bonsu  [address] [place] or [address], [place] [phone] Phone number for both [place] and [place] locations is the same.  [facility] [address], [place] [phone]   [facility] [address] [place] or [address] [phone]   [facility] [address] [place] [phone], phone; [phone], fax Sees patients 1st and 3rd Saturday of every month.  Must not qualify for public or private insurance (i.e. Medicaid, Medicare, [place] Health Choice, Veterans' Benefits)  Household income should be no more than 200% of the poverty level The clinic cannot treat you if you are pregnant or think you are pregnant  Sexually transmitted diseases are not treated at the clinic.    Dental Care: Organization         Address  Phone  Notes  [facility] [phone] Accepts children up to age 2 who are enrolled in [place] or [place]; pregnant women with a Medicaid card; and children who have applied for Medicaid or [place] Health Choice, but were declined, whose parents can pay a reduced fee at time of service.  [facility] of [facility]  [address], [place] [phone] Accepts children up to age 72 who are enrolled in [place] or [place]; pregnant women with a Medicaid card; and children who have applied for Medicaid or [place] Health Choice, but were declined, whose parents can pay a reduced fee at time of service.  [facility]  [place] [phone] Patients are seen by appointment only. Walk-ins  are not accepted. [place] will see patients 76 years of age and older. Monday - Tuesday (8am-5pm) Most Wednesdays (8:30-5pm) $30 per visit, cash only  [facility] Access PROGRAM  [address], [facility] [phone] Patients are seen by appointment only. Walk-ins are not accepted. [place] will see patients 32 years of age and older. One Wednesday Evening (Monthly: Volunteer Based).  $30 per visit, cash only  [place]  [phone] for adults; Children under age 64, call Graduate Pediatric Dentistry at [phone]. Children aged 51-14, please call [phone] to request a pediatric application.  Dental services are provided in all areas of dental care including fillings, crowns and bridges, complete and partial dentures, implants, gum treatment, root canals, and extractions. Preventive care is also provided. Treatment is provided to both adults and children. Patients are selected via a lottery and there is often a waiting list.   [facility] [address] [place]  [phone] [email or web address]   [facility]  [address] [place], [place] [phone], Ext. [phone] Second and Fourth Thursday of each month, opens at 6:30 AM; Clinic ends at 9 AM.  Patients are seen on a first-come first-served basis, and a limited number are seen during each clinic.   [facility]  [address] Hillard Danker [place], [place] [phone]   Eligibility Requirements You must have lived in [place], [place], or [place] counties for at least the last three months.   You cannot be eligible for state or federal sponsored [employer], including [employer], [place], or [employer].   You generally cannot be eligible for healthcare insurance through your employer.    How to apply: Eligibility screenings are held every Tuesday and Wednesday afternoon from 1:00 pm until 4:00 pm. You do not need an appointment for the interview!  [facility] [address], [place], [place]   [place]  [place] Department  [place]  [phone]    Behavioral Health Resources in the Community: Intensive Outpatient Programs Organization         Address  Phone  Notes  [place]. [address], [place], [place] [phone]   [facility] [address], [place], [place]   ADS: Alcohol & Drug Svcs [address], [place], [place]   [place] [address]  [place], [place] or [phone]   Substance Abuse Resources Organization         Address  Phone  Notes  Alcohol and Drug Services  [phone]   [place]  [phone]   [facility] [place]   Chinita Pester  [phone]   Residential & Outpatient Substance Abuse Program  [phone]   Psychological Services Organization         Address  Phone  Notes  [facility] [place]  [place]  [phone]   [place] [address], [place] [phone] or [phone]    Mobile Crisis Teams Organization         Address  Phone  Notes  [facility], Mobile Crisis Care Unit  [phone]   [facility]otherapeutic [facility]  [address] [place], [place]   [facility] [address] [place] [phone]  Self-Help/Support Groups Organization         Address  Phone             Notes  [facility] [place] - variety of support groups  336- [phone] Call for more information  Narcotics Anonymous (NA), [facility] [address], [place]  2 meetings at this location   Statisticianesidential Treatment Programs Organization         Address  Phone  Notes  [facility] [address],    [place]  [phone]   [facility]  [address], [place] [phone], [place], [place] [phone]   [facility] [address] [place] [address], [place] [phone] Admissions: 8am-3pm M-F  [facility] [address]    [place], [place] [phone]   [facility] [address], [place], [place] [phone]   [facility] [address]  [place], [place] [phone]   [facility] - Intensive Outpatient [address], [place], [place] [phone]   [facility] ([facility] [address]  [place], [place] [phone] or [phone]   [facility] Services (RTS) [address] [place], [place] [phone] Accepts Medicaid  [facility] [address]  [place] [phone] Substance Abuse/Addiction Treatment   [facility] Behavioral Health Resources Organization         Address  Phone  Notes  [facility]  [phone]) [phone]   Angie FavaJulie Brannon, PhD [address], [address] [place], [place]   [phone] or [phone]) [phone]    [facility]   [address] [place], [place] [phone](336) [phone]   [facility] [address], [place], [place] [phone]) [phone] Insurance/Medicaid/sponsorship through [facility]  [facility] Families [address]                                    [place], [place] [phone]) [phone] Therapy/tele-psych/case  [facility] [address].   [place], [place] [phone](336) [phone]    Dr. Lolly MustacheArfeen  [phone]) [phone]   [facility]  United Way [facility] 1) [address], [place] 2) [address], [place] 3)  [address], [place] [phone]) [phone]) [phone]  [phone](336) [phone]   [facility] Child Abuse Hotline [phone](336) [phone] or [phone]) [phone] (After Hours)

## 2014-04-15 NOTE — ED Notes (Signed)
Bed: WA12 Expected date:  Expected time:  Means of arrival:  Comments: triage 

## 2014-04-15 NOTE — ED Provider Notes (Signed)
CSN: [id number]     Arrival date & time 04/15/14  [id number] History   First MD Initiated Contact with Patient 04/15/14 [phone]     Chief Complaint  Patient presents with  . Night Sweats  . Cough  . Weight Loss     (Consider location/radiation/quality/duration/timing/severity/associated sxs/prior Treatment) HPI Patient reports about 2-3 weeks ago she started having severe night sweats, cough with yellow sometimes clear sputum. She's unsure of fever and states sometimes she has chills. She describes weight loss despite eating normally. She states she's gone from a size 8 to a size 4 in clothing. She denies nausea, vomiting, chest pain, or diarrhea. She states sometimes she feels short of breath. She reports she had a TB test for a pre-job interview a couple weeks ago that was negative. It was a skin PPD test. She denies any family history of thyroid problems. Patient does state she smokes one pack per day.  PCP none  History reviewed. No pertinent past medical history. Past Surgical History  Procedure Laterality Date  . Cesarean section     Family History  Problem Relation Age of Onset  . Diabetes Mother   . Diabetes Sister   . Hypertension Sister    History  Substance Use Topics  . Smoking status: Current Every Day Smoker -- 0.50 packs/day  . Smokeless tobacco: Not on file  . Alcohol Use: No   Employed in day care x 2 weeks Student at [employer] in [employer] Smokes 1 ppd  OB History   Grav Para Term Preterm Abortions TAB SAB Ect Mult Living                 Review of Systems  All other systems reviewed and are negative.     Allergies  Review of patient's allergies indicates no known allergies.  Home Medications   Prior to Admission medications   Medication Sig Start Date End Date Taking? Authorizing Provider  aspirin 325 MG tablet Take 325 mg by mouth daily.    Historical Provider, MD  diphenhydrAMINE (BENADRYL) 25 mg capsule Take 25 mg by mouth once.    Historical  Provider, MD  diphenhydrAMINE (BENADRYL) 25 MG tablet Take 1 tablet (25 mg total) by mouth every 6 (six) hours. 02/21/13   Loren Raceravid Yelverton, MD  predniSONE (DELTASONE) 50 MG tablet Take 1 tablet (50 mg total) by mouth daily. 02/21/13   Loren Raceravid Yelverton, MD   BP 136/74  Pulse 93  Temp(Src) 98.3 F (36.8 C) (Oral)  Resp 18  SpO2 92%  Vital signs normal   Physical Exam  Nursing note and vitals reviewed. Constitutional: She is oriented to person, place, and time.  Non-toxic appearance. She does not appear ill. No distress.  thin  HENT:  Head: Normocephalic and atraumatic.  Right Ear: External ear normal.  Left Ear: External ear normal.  Nose: Nose normal. No mucosal edema or rhinorrhea.  Mouth/Throat: Oropharynx is clear and moist and mucous membranes are normal. No dental abscesses or uvula swelling.  Eyes: Conjunctivae and EOM are normal. Pupils are equal, round, and reactive to light.  Neck: Normal range of motion and full passive range of motion without pain. Neck supple. No thyromegaly present.  Cardiovascular: Normal rate, regular rhythm and normal heart sounds.  Exam reveals no gallop and no friction rub.   No murmur heard. Pulmonary/Chest: Effort normal and breath sounds normal. No respiratory distress. She has no wheezes. She has no rhonchi. She has no rales. She exhibits no tenderness and  no crepitus.  Abdominal: Soft. Normal appearance and bowel sounds are normal. She exhibits no distension. There is no tenderness. There is no rebound and no guarding.  Musculoskeletal: Normal range of motion. She exhibits no edema and no tenderness.  Moves all extremities well.   Neurological: She is alert and oriented to person, place, and time. She has normal strength. No cranial nerve deficit.  Skin: Skin is warm, dry and intact. No rash noted. No erythema. No pallor.  Psychiatric: She has a normal mood and affect. Her speech is normal and behavior is normal. Her mood appears not anxious.     ED Course  Procedures (including critical care time)  Patient was placed in respiratory isolation since her symptoms are very suspicious for active pulmonary TB.  At 1040 we discussed her laboratory results that were back. She did question me about her STD test. I told her she did not have any STD symptoms such as pelvic pain or discharge which she's agrees with. However she is concerned because she has a new sexual partner since January. HIV testing was added.  At discharge patient still had thyroid test pending they would be about another hour. She decided she preferred to be discharged rather than get those results. We discussed getting a primary care doctor to followup to finish her evaluation for her weight loss and night sweats. Her TB test is pending. She however states she had a PPD a couple weeks ago that was negative. Other concern would be possible lymphoma, however she has no significant lymphadenopathy on her chest x-ray. This can be pursued further by her primary care doctor.  Labs Review Results for orders placed during the hospital encounter of 04/15/14  CBC WITH DIFFERENTIAL      Result Value Ref Range   WBC 11.4 (*) 4.0 - 10.5 K/uL   RBC 4.66  3.87 - 5.11 MIL/uL   Hemoglobin 13.6  12.0 - 15.0 g/dL   HCT 16.140.4  09.636.0 - 04.546.0 %   MCV 86.7  78.0 - 100.0 fL   MCH 29.2  26.0 - 34.0 pg   MCHC 33.7  30.0 - 36.0 g/dL   RDW 40.913.7  81.111.5 - 91.415.5 %   Platelets 275  150 - 400 K/uL   Neutrophils Relative % 66  43 - 77 %   Neutro Abs 7.5  1.7 - 7.7 K/uL   Lymphocytes Relative 27  12 - 46 %   Lymphs Abs 3.1  0.7 - 4.0 K/uL   Monocytes Relative 6  3 - 12 %   Monocytes Absolute 0.7  0.1 - 1.0 K/uL   Eosinophils Relative 0  0 - 5 %   Eosinophils Absolute 0.0  0.0 - 0.7 K/uL   Basophils Relative 0  0 - 1 %   Basophils Absolute 0.0  0.0 - 0.1 K/uL  COMPREHENSIVE METABOLIC PANEL      Result Value Ref Range   Sodium 138  137 - 147 mEq/L   Potassium 3.9  3.7 - 5.3 mEq/L   Chloride 103   96 - 112 mEq/L   CO2 25  19 - 32 mEq/L   Glucose, Bld 95  70 - 99 mg/dL   BUN 12  6 - 23 mg/dL   Creatinine, Ser 7.820.70  0.50 - 1.10 mg/dL   Calcium 8.7  8.4 - 95.610.5 mg/dL   Total Protein 6.8  6.0 - 8.3 g/dL   Albumin 3.4 (*) 3.5 - 5.2 g/dL   AST 16  0 - 37 U/L   ALT 12  0 - 35 U/L   Alkaline Phosphatase 121 (*) 39 - 117 U/L   Total Bilirubin 0.4  0.3 - 1.2 mg/dL   GFR calc non Af Amer >90  >90 mL/min   GFR calc Af Amer >90  >90 mL/min  TSH      Result Value Ref Range   TSH 2.290  0.350 - 4.500 uIU/mL  T4, FREE      Result Value Ref Range   Free T4 0.94  0.80 - 1.80 ng/dL  URINALYSIS, ROUTINE W REFLEX MICROSCOPIC      Result Value Ref Range   Color, Urine YELLOW  YELLOW   APPearance CLEAR  CLEAR   Specific Gravity, Urine 1.015  1.005 - 1.030   pH 6.5  5.0 - 8.0   Glucose, UA NEGATIVE  NEGATIVE mg/dL   Hgb urine dipstick MODERATE (*) NEGATIVE   Bilirubin Urine NEGATIVE  NEGATIVE   Ketones, ur NEGATIVE  NEGATIVE mg/dL   Protein, ur NEGATIVE  NEGATIVE mg/dL   Urobilinogen, UA 0.2  0.0 - 1.0 mg/dL   Nitrite NEGATIVE  NEGATIVE   Leukocytes, UA NEGATIVE  NEGATIVE  URINE MICROSCOPIC-ADD ON      Result Value Ref Range   Squamous Epithelial / LPF RARE  RARE   WBC, UA 0-2  <3 WBC/hpf   RBC / HPF 7-10  <3 RBC/hpf   Bacteria, UA FEW (*) RARE   Crystals CA OXALATE CRYSTALS (*) NEGATIVE   Urine-Other MUCOUS PRESENT    RAPID HIV SCREEN ([facility])      Result Value Ref Range   SUDS Rapid HIV Screen NON REACTIVE  NON REACTIVE  CBG MONITORING, ED      Result Value Ref Range   Glucose-Capillary 67 (*) 70 - 99 mg/dL   Laboratory interpretation all normal except leukocytosis   Imaging Review Dg Chest 2 View  04/15/2014   CLINICAL DATA:  Cough, congestion and shortness of breath.  EXAM: CHEST  2 VIEW  COMPARISON:  PA and lateral chest 01/28/2013 and 11/21/2010.  FINDINGS: Chronic patchy opacity in the left lung base compatible with scarring is unchanged. There is also biapical scarring. Two  nodular opacities project over the right lower lung zone. 1 of these is likely the patient's nipple but the other cannot be characterized. Nipple shadow on the left is noted. There is no pneumothorax or pleural effusion. Architectural distortion is identified. Heart size is normal.  IMPRESSION: Nodular opacity projecting over the right lower lung zone. Chest CT with contrast is recommended for further evaluation.  COPD.  Chronic left basilar scar.   Electronically Signed   By: Drusilla Kanner M.D.   On: 04/15/2014 09:24     EKG Interpretation None      MDM   Final diagnoses:  Bronchitis  Weight loss   Discharge Medication List as of 04/15/2014  1:12 PM    START taking these medications   Details  amoxicillin (AMOXIL) 500 MG capsule Take 1 capsule (500 mg total) by mouth 3 (three) times daily., Starting 04/15/2014, Until Discontinued, Print        Plan discharge   Devoria Albe, MD, Franz Dell, MD 04/15/14 [id number]

## 2014-04-17 LAB — QUANTIFERON TB GOLD ASSAY (BLOOD)
INTERFERON GAMMA RELEASE ASSAY: NEGATIVE
Mitogen value: >10 IU/mL
Quantiferon Nil Value: 0.05 IU/mL
TB Ag value: 0.04 IU/mL
TB Antigen Minus Nil Value: 0 IU/mL

## 2014-04-26 ENCOUNTER — Emergency Department (HOSPITAL_COMMUNITY): Payer: No Typology Code available for payment source

## 2014-04-26 ENCOUNTER — Encounter (HOSPITAL_COMMUNITY): Payer: Self-pay | Admitting: Emergency Medicine

## 2014-04-26 ENCOUNTER — Emergency Department (HOSPITAL_COMMUNITY)
Admission: EM | Admit: 2014-04-26 | Discharge: 2014-04-26 | Disposition: A | Discharge status: Home / Self Care | Payer: No Typology Code available for payment source | Attending: Emergency Medicine | Admitting: Emergency Medicine

## 2014-04-26 DIAGNOSIS — J159 Unspecified bacterial pneumonia: Secondary | ICD-10-CM | POA: Insufficient documentation

## 2014-04-26 DIAGNOSIS — J189 Pneumonia, unspecified organism: Secondary | ICD-10-CM

## 2014-04-26 DIAGNOSIS — Z880 Allergy status to penicillin: Secondary | ICD-10-CM | POA: Insufficient documentation

## 2014-04-26 DIAGNOSIS — N12 Tubulo-interstitial nephritis, not specified as acute or chronic: Secondary | ICD-10-CM | POA: Insufficient documentation

## 2014-04-26 DIAGNOSIS — Z79899 Other long term (current) drug therapy: Secondary | ICD-10-CM | POA: Insufficient documentation

## 2014-04-26 DIAGNOSIS — F17200 Nicotine dependence, unspecified, uncomplicated: Secondary | ICD-10-CM | POA: Insufficient documentation

## 2014-04-26 LAB — URINALYSIS, ROUTINE W REFLEX MICROSCOPIC
Glucose, UA: NEGATIVE mg/dL
Ketones, ur: NEGATIVE mg/dL
Nitrite: NEGATIVE
Protein, ur: 30 mg/dL — AB
Specific Gravity, Urine: 1.027 (ref 1.005–1.030)
UROBILINOGEN UA: 1 mg/dL (ref 0.0–1.0)
pH: 5.5 (ref 5.0–8.0)

## 2014-04-26 LAB — URINE MICROSCOPIC-ADD ON

## 2014-04-26 LAB — CBC WITH DIFFERENTIAL/PLATELET
BASOS ABS: 0 10*3/uL (ref 0.0–0.1)
Basophils Relative: 0 % (ref 0–1)
Eosinophils Absolute: 0 10*3/uL (ref 0.0–0.7)
Eosinophils Relative: 0 % (ref 0–5)
HCT: 35.2 % — ABNORMAL LOW (ref 36.0–46.0)
HEMOGLOBIN: 12.2 g/dL (ref 12.0–15.0)
LYMPHS PCT: 5 % — AB (ref 12–46)
Lymphs Abs: 1.5 10*3/uL (ref 0.7–4.0)
MCH: 29.7 pg (ref 26.0–34.0)
MCHC: 34.7 g/dL (ref 30.0–36.0)
MCV: 85.6 fL (ref 78.0–100.0)
MONOS PCT: 5 % (ref 3–12)
Monocytes Absolute: 1.3 10*3/uL — ABNORMAL HIGH (ref 0.1–1.0)
NEUTROS ABS: 24.6 10*3/uL — AB (ref 1.7–7.7)
Neutrophils Relative %: 90 % — ABNORMAL HIGH (ref 43–77)
Platelets: 415 10*3/uL — ABNORMAL HIGH (ref 150–400)
RBC: 4.11 MIL/uL (ref 3.87–5.11)
RDW: 13.7 % (ref 11.5–15.5)
WBC: 27.4 10*3/uL — AB (ref 4.0–10.5)

## 2014-04-26 LAB — COMPREHENSIVE METABOLIC PANEL
ALK PHOS: 159 U/L — AB (ref 39–117)
ALT: 11 U/L (ref 0–35)
AST: 14 U/L (ref 0–37)
Albumin: 3.3 g/dL — ABNORMAL LOW (ref 3.5–5.2)
BILIRUBIN TOTAL: 0.6 mg/dL (ref 0.3–1.2)
BUN: 13 mg/dL (ref 6–23)
CHLORIDE: 102 meq/L (ref 96–112)
CO2: 23 meq/L (ref 19–32)
CREATININE: 0.66 mg/dL (ref 0.50–1.10)
Calcium: 8.8 mg/dL (ref 8.4–10.5)
GFR calc Af Amer: >90 mL/min (ref >90)
Glucose, Bld: 114 mg/dL — ABNORMAL HIGH (ref 70–99)
POTASSIUM: 3.6 meq/L — AB (ref 3.7–5.3)
Sodium: 138 mEq/L (ref 137–147)
Total Protein: 7.2 g/dL (ref 6.0–8.3)

## 2014-04-26 LAB — I-STAT CG4 LACTIC ACID, ED: LACTIC ACID, VENOUS: 1.65 mmol/L (ref 0.5–2.2)

## 2014-04-26 MED ORDER — ONDANSETRON HCL 4 MG/2ML IJ SOLN: 4.0000 mg | Freq: Once | INTRAMUSCULAR | Status: DC

## 2014-04-26 MED ORDER — SODIUM CHLORIDE 0.9 % IV BOLUS (SEPSIS)
1000.0000 mL | Freq: Once | INTRAVENOUS | Status: AC
  Administered 2014-04-26: 1000 mL via INTRAVENOUS

## 2014-04-26 MED ORDER — ACETAMINOPHEN 325 MG PO TABS
650.0000 mg | ORAL_TABLET | Freq: Once | ORAL | Status: AC
  Administered 2014-04-26: 650 mg via ORAL
  Filled 2014-04-26: qty 2

## 2014-04-26 MED ORDER — LEVOFLOXACIN IN D5W 750 MG/150ML IV SOLN
750.0000 mg | Freq: Once | INTRAVENOUS | Status: AC
  Administered 2014-04-26: 750 mg via INTRAVENOUS
  Filled 2014-04-26: qty 150

## 2014-04-26 MED ORDER — MORPHINE SULFATE 4 MG/ML IJ SOLN: 4.0000 mg | Freq: Once | INTRAMUSCULAR | Status: DC

## 2014-04-26 MED ORDER — LEVOFLOXACIN 750 MG PO TABS: 750.0000 mg | ORAL_TABLET | Freq: Every day | ORAL | Status: DC

## 2014-04-26 NOTE — Discharge Instructions (Signed)
Pneumonia, Adult °Pneumonia is an infection of the lungs.  °CAUSES °Pneumonia may be caused by bacteria or a virus. Usually, these infections are caused by breathing infectious particles into the lungs (respiratory tract). °SYMPTOMS  °· Cough. °· Fever. °· Chest pain. °· Increased rate of breathing. °· Wheezing. °· Mucus production. °DIAGNOSIS  °If you have the common symptoms of pneumonia, your caregiver will typically confirm the diagnosis with a chest X-ray. The X-ray will show an abnormality in the lung (pulmonary infiltrate) if you have pneumonia. Other tests of your blood, urine, or sputum may be done to find the specific cause of your pneumonia. Your caregiver may also do tests (blood gases or pulse oximetry) to see how well your lungs are working. °TREATMENT  °Some forms of pneumonia may be spread to other people when you cough or sneeze. You may be asked to wear a mask before and during your exam. Pneumonia that is caused by bacteria is treated with antibiotic medicine. Pneumonia that is caused by the influenza virus may be treated with an antiviral medicine. Most other viral infections must run their course. These infections will not respond to antibiotics.  °PREVENTION °A pneumococcal shot (vaccine) is available to prevent a common bacterial cause of pneumonia. This is usually suggested for: °· People over 65 years old. °· Patients on chemotherapy. °· People with chronic lung problems, such as bronchitis or emphysema. °· People with immune system problems. °If you are over 65 or have a high risk condition, you may receive the pneumococcal vaccine if you have not received it before. In some countries, a routine influenza vaccine is also recommended. This vaccine can help prevent some cases of pneumonia. You may be offered the influenza vaccine as part of your care. °If you smoke, it is time to quit. You may receive instructions on how to stop smoking. Your caregiver can provide medicines and counseling to  help you quit. °HOME CARE INSTRUCTIONS  °· Cough suppressants may be used if you are losing too much rest. However, coughing protects you by clearing your lungs. You should avoid using cough suppressants if you can. °· Your caregiver may have prescribed medicine if he or she thinks your pneumonia is caused by a bacteria or influenza. Finish your medicine even if you start to feel better. °· Your caregiver may also prescribe an expectorant. This loosens the mucus to be coughed up. °· Only take over-the-counter or prescription medicines for pain, discomfort, or fever as directed by your caregiver. °· Do not smoke. Smoking is a common cause of bronchitis and can contribute to pneumonia. If you are a smoker and continue to smoke, your cough may last several weeks after your pneumonia has cleared. °· A cold steam vaporizer or humidifier in your room or home may help loosen mucus. °· Coughing is often worse at night. Sleeping in a semi-upright position in a recliner or using a couple pillows under your head will help with this. °· Get rest as you feel it is needed. Your body will usually let you know when you need to rest. °SEEK IMMEDIATE MEDICAL CARE IF:  °· Your illness becomes worse. This is especially true if you are elderly or weakened from any other disease. °· You cannot control your cough with suppressants and are losing sleep. °· You begin coughing up blood. °· You develop pain which is getting worse or is uncontrolled with medicines. °· You have a fever. °· Any of the symptoms which initially brought you in for treatment   are getting worse rather than better.  You develop shortness of breath or chest pain. MAKE SURE YOU:   Understand these instructions.  Will watch your condition.  Will get help right away if you are not doing well or get worse. Document Released: 11/27/2005 Document Revised: 02/19/2012 Document Reviewed: 02/16/2011 [facility] Patient Information 2014 [place], [place].  Pyelonephritis,  Adult Pyelonephritis is a kidney infection. In general, there are 2 main types of pyelonephritis:  Infections that come on quickly without any warning (acute pyelonephritis).  Infections that persist for a long period of time (chronic pyelonephritis). CAUSES  Two main causes of pyelonephritis are:  Bacteria traveling from the bladder to the kidney. This is a problem especially in pregnant women. The urine in the bladder can become filled with bacteria from multiple causes, including:  Inflammation of the prostate gland (prostatitis).  Sexual intercourse in females.  Bladder infection (cystitis).  Bacteria traveling from the bloodstream to the tissue part of the kidney. Problems that may increase your risk of getting a kidney infection include:  Diabetes.  Kidney stones or bladder stones.  Cancer.  Catheters placed in the bladder.  Other abnormalities of the kidney or ureter. SYMPTOMS   Abdominal pain.  Pain in the side or flank area.  Fever.  Chills.  Upset stomach.  Blood in the urine (dark urine).  Frequent urination.  Strong or persistent urge to urinate.  Burning or stinging when urinating. DIAGNOSIS  Your caregiver may diagnose your kidney infection based on your symptoms. A urine sample may also be taken. TREATMENT  In general, treatment depends on how severe the infection is.   If the infection is mild and caught early, your caregiver may treat you with oral antibiotics and send you home.  If the infection is more severe, the bacteria may have gotten into the bloodstream. This will require intravenous (IV) antibiotics and a hospital stay. Symptoms may include:  High fever.  Severe flank pain.  Shaking chills.  Even after a hospital stay, your caregiver may require you to be on oral antibiotics for a period of time.  Other treatments may be required depending upon the cause of the infection. HOME CARE INSTRUCTIONS   Take your antibiotics as  directed. Finish them even if you start to feel better.  Make an appointment to have your urine checked to make sure the infection is gone.  Drink enough fluids to keep your urine clear or pale yellow.  Take medicines for the bladder if you have urgency and frequency of urination as directed by your caregiver. SEEK IMMEDIATE MEDICAL CARE IF:   You have a fever or persistent symptoms for more than 2-3 days.  You have a fever and your symptoms suddenly get worse.  You are unable to take your antibiotics or fluids.  You develop shaking chills.  You experience extreme weakness or fainting.  There is no improvement after 2 days of treatment. MAKE SURE YOU:  Understand these instructions.  Will watch your condition.  Will get help right away if you are not doing well or get worse. Document Released: 11/27/2005 Document Revised: 05/28/2012 Document Reviewed: 05/03/2011 [facility] Patient Information 2014 [place], [place].

## 2014-04-26 NOTE — ED Provider Notes (Signed)
CSN: [id number]     Arrival date & time 04/26/14  1719 History   First MD Initiated Contact with Patient 04/26/14 1741     No chief complaint on file.    (Consider location/radiation/quality/duration/timing/severity/associated sxs/prior Treatment) The history is provided by the patient.   patient presents with fevers and fatigue. She's had fevers up to 102. It began today. She has had a cough and some right-sided chest pain. She also has had some dysuria and slight discharge that began yesterday. She's had nausea without vomiting. No diarrhea. She states she feels bad all over.  History reviewed. No pertinent past medical history. Past Surgical History  Procedure Laterality Date  . Cesarean section     Family History  Problem Relation Age of Onset  . Diabetes Mother   . Diabetes Sister   . Hypertension Sister    History  Substance Use Topics  . Smoking status: Current Every Day Smoker -- 0.50 packs/day  . Smokeless tobacco: Not on file  . Alcohol Use: No   OB History   Grav Para Term Preterm Abortions TAB SAB Ect Mult Living                 Review of Systems  Constitutional: Positive for fever, appetite change and fatigue. Negative for activity change.  Eyes: Negative for pain.  Respiratory: Positive for cough and wheezing. Negative for chest tightness and shortness of breath.   Cardiovascular: Negative for chest pain and leg swelling.  Gastrointestinal: Positive for nausea and abdominal pain. Negative for vomiting and diarrhea.  Genitourinary: Positive for dysuria. Negative for flank pain.  Musculoskeletal: Positive for myalgias. Negative for back pain and neck stiffness.  Skin: Negative for rash.  Neurological: Negative for weakness, numbness and headaches.  Psychiatric/Behavioral: Negative for behavioral problems.      Allergies  Amoxicillin  Home Medications   Prior to Admission medications   Medication Sig Start Date End Date Taking? Authorizing Provider   acetaminophen (TYLENOL) 500 MG tablet Take 500 mg by mouth every 6 (six) hours as needed for mild pain.   Yes Historical Provider, MD  Multiple Vitamins-Minerals (MULTI ADULT GUMMIES) CHEW Chew 1 each by mouth.   Yes Historical Provider, MD   BP 115/55  Pulse 72  Temp(Src) 99.5 F (37.5 C) (Oral)  Resp 20  SpO2 97% Physical Exam  Nursing note and vitals reviewed. Constitutional: She is oriented to person, place, and time. She appears well-developed and well-nourished.  HENT:  Head: Normocephalic and atraumatic.  Eyes: EOM are normal. Pupils are equal, round, and reactive to light.  Neck: Normal range of motion. Neck supple.  Cardiovascular: Regular rhythm and normal heart sounds.   No murmur heard. Tachycardia  Pulmonary/Chest: Effort normal. No respiratory distress. She has wheezes. She has no rales.  Mild wheezes in bilateral bases.  Abdominal: Soft. Bowel sounds are normal. She exhibits no distension. There is tenderness. There is no rebound and no guarding.  Right-sided abdominal pain and tenderness. No rebound or guarding. No rash.  Musculoskeletal: Normal range of motion.  Neurological: She is alert and oriented to person, place, and time. No cranial nerve deficit.  Skin: Skin is warm and dry.  Psychiatric: She has a normal mood and affect. Her speech is normal.    ED Course  Procedures (including critical care time) Labs Review Labs Reviewed  CBC WITH DIFFERENTIAL - Abnormal; Notable for the following:    WBC 27.4 (*)    HCT 35.2 (*)    Platelets  415 (*)    Neutrophils Relative % 90 (*)    Neutro Abs 24.6 (*)    Lymphocytes Relative 5 (*)    Monocytes Absolute 1.3 (*)    All other components within normal limits  COMPREHENSIVE METABOLIC PANEL - Abnormal; Notable for the following:    Potassium 3.6 (*)    Glucose, Bld 114 (*)    Albumin 3.3 (*)    Alkaline Phosphatase 159 (*)    All other components within normal limits  URINALYSIS, ROUTINE W REFLEX MICROSCOPIC  - Abnormal; Notable for the following:    Color, Urine AMBER (*)    APPearance CLOUDY (*)    Hgb urine dipstick LARGE (*)    Bilirubin Urine SMALL (*)    Protein, ur 30 (*)    Leukocytes, UA LARGE (*)    All other components within normal limits  URINE MICROSCOPIC-ADD ON - Abnormal; Notable for the following:    Bacteria, UA MANY (*)    All other components within normal limits  URINE CULTURE  I-STAT CG4 LACTIC ACID, ED    Imaging Review Dg Chest 2 View  04/26/2014   CLINICAL DATA:  Productive cough for 2 days  EXAM: CHEST  2 VIEW  COMPARISON:  04/15/2014  FINDINGS: Normal heart size. No pleural effusion or edema. Chronic interstitial coarsening suggestive of emphysema is identified. Airspace disease within the left lower lobe is identified compatible with pneumonia. Review of the visualized bony structures is unremarkable.  IMPRESSION: 1. Left lower lobe pneumonia. 2. Emphysema.   Electronically Signed   By: Signa Kellaylor  Stroud M.D.   On: 04/26/2014 18:18     EKG Interpretation None      MDM   Final diagnoses:  CAP (community acquired pneumonia)  Pyelonephritis    Patient presents with cough and dysuria. Has acquired pneumonia and clinical pyelonephritis since pain goes up abdomen. Patient does have fever and is tachycardic. Her blood pressure was mildly low, but improved stable. Lactase is normal. Patient feels much better and will be discharged home    [facility] Psych[facility] Health FacilityNathan R. Rubin PayorPickering, MD 04/26/14 [phone]

## 2014-04-26 NOTE — ED Notes (Signed)
She c/o cough "for a few weeks now--they told me it was bronchitis".  She states the cough has recently increased in frequency and also has become more productive of "freen" phlegm.  She also tells me that ~2 weeks ago she was treated for u.t.i. With Amoxicillin; "but the Amoxicillin gave me a greenish vaginal discharge".  She is in no distress.

## 2014-04-27 LAB — URINE CULTURE

## 2014-04-30 ENCOUNTER — Telehealth (HOSPITAL_BASED_OUTPATIENT_CLINIC_OR_DEPARTMENT_OTHER): Payer: Self-pay | Admitting: Emergency Medicine

## 2014-04-30 NOTE — Telephone Encounter (Signed)
Post ED Visit - Positive Culture Follow-up  Culture report reviewed by antimicrobial stewardship pharmacist: []  Wes Dulaney, Pharm.D., BCPS []  Celedonio MiyamotoJeremy Frens, Pharm.D., BCPS []  Georgina PillionElizabeth Martin, Pharm.D., BCPS []  Lame DeerMinh Pham, [address] Rainbow BoulevardPharm.D., BCPS, AAHIVP []  Estella HuskMichelle Turner, Pharm.D., BCPS, AAHIVP []  Harvie JuniorNathan Cope, Pharm.D. [x]  Ofilia NeasSandra Yang, Pharm.D.  Positive urine culture Treated with Levofloxacin, organism sensitive to the same and no further patient follow-up is required at this time.  Shirleymae Hauth 04/30/2014, 10:44 AM

## 2014-05-16 ENCOUNTER — Encounter (HOSPITAL_COMMUNITY): Payer: Self-pay | Admitting: *Deleted

## 2014-05-16 ENCOUNTER — Other Ambulatory Visit: Payer: Self-pay | Admitting: Advanced Practice Midwife

## 2014-05-16 ENCOUNTER — Inpatient Hospital Stay (HOSPITAL_COMMUNITY)
Admission: AD | Admit: 2014-05-16 | Discharge: 2014-05-16 | Disposition: A | Discharge status: Home / Self Care | Payer: No Typology Code available for payment source | Source: Ambulatory Visit | Attending: Obstetrics & Gynecology | Admitting: Obstetrics & Gynecology

## 2014-05-16 DIAGNOSIS — A5901 Trichomonal vulvovaginitis: Secondary | ICD-10-CM | POA: Insufficient documentation

## 2014-05-16 DIAGNOSIS — R109 Unspecified abdominal pain: Secondary | ICD-10-CM | POA: Insufficient documentation

## 2014-05-16 DIAGNOSIS — N390 Urinary tract infection, site not specified: Secondary | ICD-10-CM

## 2014-05-16 DIAGNOSIS — M549 Dorsalgia, unspecified: Secondary | ICD-10-CM | POA: Diagnosis not present

## 2014-05-16 DIAGNOSIS — F17200 Nicotine dependence, unspecified, uncomplicated: Secondary | ICD-10-CM | POA: Diagnosis not present

## 2014-05-16 LAB — URINE MICROSCOPIC-ADD ON

## 2014-05-16 LAB — WET PREP, GENITAL
Clue Cells Wet Prep HPF POC: NONE SEEN
YEAST WET PREP: NONE SEEN

## 2014-05-16 LAB — URINALYSIS, ROUTINE W REFLEX MICROSCOPIC
BILIRUBIN URINE: NEGATIVE
Glucose, UA: NEGATIVE mg/dL
Ketones, ur: 15 mg/dL — AB
Nitrite: NEGATIVE
PH: 5.5 (ref 5.0–8.0)
Protein, ur: NEGATIVE mg/dL
Specific Gravity, Urine: >1.03 — ABNORMAL HIGH (ref 1.005–1.030)
Urobilinogen, UA: 0.2 mg/dL (ref 0.0–1.0)

## 2014-05-16 MED ORDER — METRONIDAZOLE 500 MG PO TABS
2000.0000 mg | ORAL_TABLET | Freq: Once | ORAL | Status: AC
  Administered 2014-05-16: 2000 mg via ORAL
  Filled 2014-05-16: qty 4

## 2014-05-16 MED ORDER — FLUCONAZOLE 150 MG PO TABS: 150.0000 mg | ORAL_TABLET | Freq: Once | ORAL | Status: DC

## 2014-05-16 MED ORDER — SULFAMETHOXAZOLE-TMP DS 800-160 MG PO TABS: 1.0000 | ORAL_TABLET | Freq: Two times a day (BID) | ORAL | Status: DC

## 2014-05-16 NOTE — MAU Note (Signed)
abd pain and back - started a couple days ago.  Lower abd around to back- sharp pain. Denies any GI or GU problems.

## 2014-05-16 NOTE — MAU Provider Note (Signed)
Attestation of Attending Supervision of Advanced Practitioner (CNM/NP): Evaluation and management procedures were performed by the Advanced Practitioner under my supervision and collaboration. I have reviewed the Advanced Practitioner's note and chart, and I agree with the management and plan.  Lesly Dukes 7:26 PM

## 2014-05-16 NOTE — MAU Provider Note (Addendum)
History     CSN: [id number]  Arrival date and time: 05/16/14 1546   None     Chief Complaint  Patient presents with  . Abdominal Pain  . Back Pain   HPI Felicia Lawson 58 y.o.  Comes to MAU with lower abdominal pain.  Was seen 3 weeks ago at [facility] ER for pneumonia.  That has gotten better but now is having lower abdominal pain.  Last intercourse was 3 weeks ago.  Does not have a doctor she sees regularly.  Wants to see the MD here today - explained MAU is staffed with NP, PA and CNM.  OB History   Grav Para Term Preterm Abortions TAB SAB Ect Mult Living   9 8 8  0 1 1 0 0 0 6      History reviewed. No pertinent past medical history.  Past Surgical History  Procedure Laterality Date  . Cesarean section    . Tubal ligation      Family History  Problem Relation Age of Onset  . Diabetes Mother   . Diabetes Sister   . Hypertension Sister     History  Substance Use Topics  . Smoking status: Current Every Day Smoker -- 0.50 packs/day  . Smokeless tobacco: Not on file  . Alcohol Use: No    Allergies: No Known Allergies  Prescriptions prior to admission  Medication Sig Dispense Refill  . Ibuprofen (MIDOL PO) Take 1 tablet by mouth daily as needed.        Review of Systems  Constitutional: Negative for fever.  Gastrointestinal: Positive for abdominal pain. Negative for nausea, vomiting, diarrhea and constipation.  Genitourinary:       No vaginal discharge. No vaginal bleeding. No dysuria.   Physical Exam   Blood pressure 114/65, pulse 101, temperature 98.8 F (37.1 C), temperature source Oral, resp. rate 18, height 5' 1.5" (1.562 m), weight 109 lb (49.442 kg), SpO2 98.00%.  Physical Exam  Constitutional: She is oriented to person, place, and time. She appears well-developed and well-nourished. No distress.  HENT:  Head: Normocephalic.  Cardiovascular: Normal rate.   Respiratory: Effort normal.  GI: Soft. She exhibits no distension. There is tenderness  (pelvic). There is no rebound and no guarding.  Genitourinary: Uterus normal. Vaginal discharge (thin white) found.  Bilateral pelvic tenderness   Musculoskeletal: Normal range of motion.  Neurological: She is alert and oriented to person, place, and time.  Skin: Skin is warm and dry.  Psychiatric: She has a normal mood and affect.    MAU Course  Procedures  MDM 1810  Care assumed by M. Mayford Knife, CNM  Results for orders placed during the hospital encounter of 05/16/14 (from the past 24 hour(s))  URINALYSIS, ROUTINE W REFLEX MICROSCOPIC     Status: Abnormal   Collection Time    05/16/14  4:05 PM      Result Value Ref Range   Color, Urine YELLOW  YELLOW   APPearance HAZY (*) CLEAR   Specific Gravity, Urine >1.030 (*) 1.005 - 1.030   pH 5.5  5.0 - 8.0   Glucose, UA NEGATIVE  NEGATIVE mg/dL   Hgb urine dipstick MODERATE (*) NEGATIVE   Bilirubin Urine NEGATIVE  NEGATIVE   Ketones, ur 15 (*) NEGATIVE mg/dL   Protein, ur NEGATIVE  NEGATIVE mg/dL   Urobilinogen, UA 0.2  0.0 - 1.0 mg/dL   Nitrite NEGATIVE  NEGATIVE   Leukocytes, UA MODERATE (*) NEGATIVE  URINE MICROSCOPIC-ADD ON     Status:  Abnormal   Collection Time    05/16/14  4:05 PM      Result Value Ref Range   Squamous Epithelial / LPF FEW (*) RARE   WBC, UA 11-20  <3 WBC/hpf   RBC / HPF 3-6  <3 RBC/hpf   Bacteria, UA FEW (*) RARE   Crystals CA OXALATE CRYSTALS (*) NEGATIVE  WET PREP, GENITAL     Status: Abnormal   Collection Time    05/16/14  6:20 PM      Result Value Ref Range   Yeast Wet Prep HPF POC NONE SEEN  NONE SEEN   Trich, Wet Prep RARE (*) NONE SEEN   Clue Cells Wet Prep HPF POC NONE SEEN  NONE SEEN   WBC, Wet Prep HPF POC MODERATE (*) NONE SEEN    Assessment and Plan  A:  Probable UTI      Trichomonas  P:  Rx Septra DS x 3 d       Flagyl 2gm given today       Recommend partner treatment       Push fluids       Pt had many questions about dehydration, weight loss, cancer and donating plasma.  I  stressed the importance of starting [facility] or [facility]ist. Referral phone number for [facility] given Aviva SignsMarie L Rogina Lawson, CNM  Currie Pariserri L Burleson 05/16/2014, 6:15 PM

## 2014-05-16 NOTE — Discharge Instructions (Signed)
Urinary Tract Infection °Urinary tract infections (UTIs) can develop anywhere along your urinary tract. Your urinary tract is your body's drainage system for removing wastes and extra water. Your urinary tract includes two kidneys, two ureters, a bladder, and a urethra. Your kidneys are a pair of bean-shaped organs. Each kidney is about the size of your fist. They are located below your ribs, one on each side of your spine. °CAUSES °Infections are caused by microbes, which are microscopic organisms, including fungi, viruses, and bacteria. These organisms are so small that they can only be seen through a microscope. Bacteria are the microbes that most commonly cause UTIs. °SYMPTOMS  °Symptoms of UTIs may vary by age and gender of the patient and by the location of the infection. Symptoms in young women typically include a frequent and intense urge to urinate and a painful, burning feeling in the bladder or urethra during urination. Older women and men are more likely to be tired, shaky, and weak and have muscle aches and abdominal pain. A fever may mean the infection is in your kidneys. Other symptoms of a kidney infection include pain in your back or sides below the ribs, nausea, and vomiting. °DIAGNOSIS °To diagnose a UTI, your caregiver will ask you about your symptoms. Your caregiver also will ask to provide a urine sample. The urine sample will be tested for bacteria and white blood cells. White blood cells are made by your body to help fight infection. °TREATMENT  °Typically, UTIs can be treated with medication. Because most UTIs are caused by a bacterial infection, they usually can be treated with the use of antibiotics. The choice of antibiotic and length of treatment depend on your symptoms and the type of bacteria causing your infection. °HOME CARE INSTRUCTIONS °· If you were prescribed antibiotics, take them exactly as your caregiver instructs you. Finish the medication even if you feel better after you  have only taken some of the medication. °· Drink enough water and fluids to keep your urine clear or pale yellow. °· Avoid caffeine, tea, and carbonated beverages. They tend to irritate your bladder. °· Empty your bladder often. Avoid holding urine for long periods of time. °· Empty your bladder before and after sexual intercourse. °· After a bowel movement, women should cleanse from front to back. Use each tissue only once. °SEEK MEDICAL CARE IF:  °· You have back pain. °· You develop a fever. °· Your symptoms do not begin to resolve within 3 days. °SEEK IMMEDIATE MEDICAL CARE IF:  °· You have severe back pain or lower abdominal pain. °· You develop chills. °· You have nausea or vomiting. °· You have continued burning or discomfort with urination. °MAKE SURE YOU:  °· Understand these instructions. °· Will watch your condition. °· Will get help right away if you are not doing well or get worse. °Document Released: 09/06/2005 Document Revised: 05/28/2012 Document Reviewed: 01/05/2012 °ExitCare® Patient Information ©2014 [employer]. °Trichomoniasis °Trichomoniasis is an infection, caused by the Trichomonas organism, that affects both women and men. In women, the outer female genitalia and the vagina are affected. In men, the penis is mainly affected, but the prostate and other reproductive organs can also be involved. Trichomoniasis is a sexually transmitted disease (STD) and is most often passed to another person through sexual contact. The majority of people who get trichomoniasis do so from a sexual encounter and are also at risk for other STDs. °CAUSES  °· Sexual intercourse with an infected partner. °· It can   be present in swimming pools or hot tubs. °SYMPTOMS  °· Abnormal gray-green frothy vaginal discharge in women. °· Vaginal itching and irritation in women. °· Itching and irritation of the area outside the vagina in women. °· Penile discharge with or without pain in males. °· Inflammation of the urethra  (urethritis), causing painful urination. °· Bleeding after sexual intercourse. °RELATED COMPLICATIONS °· Pelvic inflammatory disease. °· Infection of the uterus (endometritis). °· Infertility. °· Tubal (ectopic) pregnancy. °· It can be associated with other STDs, including gonorrhea and chlamydia, hepatitis B, and HIV. °COMPLICATIONS DURING PREGNANCY °· Early (premature) delivery. °· Premature rupture of the membranes (PROM). °· Low birth weight. °DIAGNOSIS  °· Visualization of Trichomonas under the microscope from the vagina discharge. °· Ph of the vagina greater than 4.5, tested with a test tape. °· Trich Rapid Test. °· Culture of the organism, but this is not usually needed. °· It may be found on a Pap test. °· Having a "strawberry cervix,"which means the cervix looks very red like a strawberry. °TREATMENT  °· You may be given medication to fight the infection. Inform your caregiver if you could be or are pregnant. Some medications used to treat the infection should not be taken during pregnancy. °· Over-the-counter medications or creams to decrease itching or irritation may be recommended. °· Your sexual partner will need to be treated if infected. °HOME CARE INSTRUCTIONS  °· Take all medication prescribed by your caregiver. °· Take over-the-counter medication for itching or irritation as directed by your caregiver. °· Do not have sexual intercourse while you have the infection. °· Do not douche or wear tampons. °· Discuss your infection with your partner, as your partner may have acquired the infection from you. Or, your partner may have been the person who transmitted the infection to you. °· Have your sex partner examined and treated if necessary. °· Practice safe, informed, and protected sex. °· See your caregiver for other STD testing. °SEEK MEDICAL CARE IF:  °· You still have symptoms after you finish the medication. °· You have an oral temperature above 102° F (38.9° C). °· You develop belly (abdominal)  pain. °· You have pain when you urinate. °· You have bleeding after sexual intercourse. °· You develop a rash. °· The medication makes you sick or makes you throw up (vomit). °Document Released: 05/23/2001 Document Revised: 02/19/2012 Document Reviewed: 06/18/2009 °ExitCare® Patient Information ©2014 [employer]. ° °

## 2014-05-16 NOTE — Progress Notes (Signed)
Wants Rx changed to [facility] on [address]. I called it in. Aviva Signs, CNM

## 2014-05-16 NOTE — MAU Note (Signed)
Discussed plan of care with pt before she signed out AMA, and after she signed she decided she would like to have a pelvic exam.

## 2014-05-16 NOTE — MAU Note (Addendum)
2-3 wks ago was dx with pneumonia and a UTI. Been losing weight- without trying

## 2014-05-17 LAB — URINE CULTURE: Colony Count: 10000

## 2014-05-18 LAB — GC/CHLAMYDIA PROBE AMP
CT Probe RNA: NEGATIVE
GC PROBE AMP APTIMA: NEGATIVE

## 2014-05-18 NOTE — MAU Provider Note (Signed)
Attestation of Attending Supervision of Advanced Practitioner (CNM/NP): Evaluation and management procedures were performed by the Advanced Practitioner under my supervision and collaboration. I have reviewed the Advanced Practitioner's note and chart, and I agree with the management and plan.  Kaytie Ratcliffe H Tanylah Schnoebelen 11:02 AM

## 2014-07-30 ENCOUNTER — Encounter (HOSPITAL_COMMUNITY): Payer: Self-pay | Admitting: Emergency Medicine

## 2014-07-30 ENCOUNTER — Emergency Department (INDEPENDENT_AMBULATORY_CARE_PROVIDER_SITE_OTHER)
Admission: EM | Admit: 2014-07-30 | Discharge: 2014-07-30 | Disposition: A | Discharge status: Home / Self Care | Payer: No Typology Code available for payment source | Source: Home / Self Care

## 2014-07-30 DIAGNOSIS — S90122A Contusion of left lesser toe(s) without damage to nail, initial encounter: Secondary | ICD-10-CM

## 2014-07-30 DIAGNOSIS — IMO0002 Reserved for concepts with insufficient information to code with codable children: Secondary | ICD-10-CM

## 2014-07-30 DIAGNOSIS — S90129A Contusion of unspecified lesser toe(s) without damage to nail, initial encounter: Secondary | ICD-10-CM

## 2014-07-30 NOTE — ED Provider Notes (Signed)
CSN: [id number]     Arrival date & time 07/30/14  1318 History   First MD Initiated Contact with Patient 07/30/14 1417     Chief Complaint  Patient presents with  . Foot Injury   (Consider location/radiation/quality/duration/timing/severity/associated sxs/prior Treatment) HPI Comments: 58 year old female stubbed the left fourth toe approximately 36 hours ago. She describes swelling and ecchymosis to the fourth toe. She went to work yesterday after the accident and was able to work without pain or limitations. She denies tenderness or pain with ambulation.   History reviewed. No pertinent past medical history. Past Surgical History  Procedure Laterality Date  . Cesarean section    . Tubal ligation     Family History  Problem Relation Age of Onset  . Diabetes Mother   . Diabetes Sister   . Hypertension Sister    History  Substance Use Topics  . Smoking status: Current Every Day Smoker -- 0.50 packs/day  . Smokeless tobacco: Not on file  . Alcohol Use: No   OB History   Grav Para Term Preterm Abortions TAB SAB Ect Mult Living   9 8 8  0 1 1 0 0 0 6     Review of Systems  Constitutional: Negative.   HENT: Negative.   Musculoskeletal:       As per history of present illness  Skin: Positive for color change. Negative for rash.  Neurological: Negative.     Allergies  Review of patient's allergies indicates no known allergies.  Home Medications   Prior to Admission medications   Medication Sig Start Date End Date Taking? Authorizing Provider  fluconazole (DIFLUCAN) 150 MG tablet Take 1 tablet (150 mg total) by mouth once. 05/16/14   Aviva Signs, CNM  Ibuprofen (MIDOL PO) Take 1 tablet by mouth daily as needed.    Historical Provider, MD  sulfamethoxazole-trimethoprim (BACTRIM DS) 800-160 MG per tablet Take 1 tablet by mouth 2 (two) times daily. 05/16/14   Aviva Signs, CNM   BP 137/70  Pulse 64  Temp(Src) 98.3 F (36.8 C) (Oral)  Resp 14  SpO2 97% Physical Exam   Nursing note and vitals reviewed. Constitutional: She is oriented to person, place, and time. She appears well-developed and well-nourished. No distress.  Neck: Normal range of motion. Neck supple.  Pulmonary/Chest: Effort normal. No respiratory distress.  Musculoskeletal: Normal range of motion.  Mild swelling and ecchymosis to the left fourth toe. She is able to extend and flex the toe actively. There is no tenderness. Passive flexion and extension is intact. Capillary refill is brisk. No tenderness to the foot. There is minor swelling to the distal forefoot at the base of the fourth toe. No tenderness. Pedal pulses 2+.  Neurological: She is alert and oriented to person, place, and time.  Skin: Skin is warm and dry.  Psychiatric: She has a normal mood and affect.    ED Course  Procedures (including critical care time) Labs Review Labs Reviewed - No data to display  Imaging Review No results found.   MDM   1. Toe contusion, left, initial encounter    The patient does not wish to have an x-ray. She wanted to see a doctor to verify that the toe or foot did not need amputation. She states she is not limited in doing her job. She does not want the x-ray door nor does she want additional treatment. I explained to her that there is a possibility of a fracture and continuing to flex and extend  the toes could worsen the pain and a lateral nonunion of a potential fracture. This was calls greater pain in the right her time in healing. She states she understands this but does not believe her toe is broken. The patient wants a release stating that she may go back to work. The patient is admonished that should she continue to have pain, worsening of pain, limitation of function, increased redness, swelling, drainage or other problems she should stop working and seek medical attention promptly.    Hayden Rasmussenavid Aemon Koeller, NP 07/30/14 1435

## 2014-07-30 NOTE — ED Provider Notes (Signed)
Medical screening examination/treatment/procedure(s) were performed by non-physician practitioner and as supervising physician I was immediately available for consultation/collaboration.  Leslee Homeavid Heidee Audi, M.D.  Reuben Likesavid C Eldoris Beiser, MD 07/30/14 [phone]

## 2014-07-30 NOTE — ED Notes (Signed)
patient refused x-ray. Discussed how he can buddy tape her own toes w padding. Advised to return if her pain is worse or if she develops new symptoms

## 2014-07-30 NOTE — ED Notes (Signed)
States she stubbed her left 4 th toes Tuesday night/wenesday am , struck foot on furniture. States he slipped on 2 pairs of socks and squeezed to straighten it out

## 2014-07-30 NOTE — Discharge Instructions (Signed)
Buddy Taping of Toes °We have taped your toes together to keep them from moving. This is called "buddy taping" since we used a part of your own body to keep the injured part still. We placed soft padding between your toes to keep them from rubbing against each other. Buddy taping will help with healing and to reduce pain. Keep your toes buddy taped together for as long as directed by your caregiver. °HOME CARE INSTRUCTIONS  °· Raise your injured area above the level of your heart while sitting or lying down. Prop it up with pillows. °· An ice pack used every twenty minutes, while awake, for the first one to two days may be helpful. Put ice in a plastic bag and put a towel between the bag and your skin. °· Watch for signs that the taping is too tight. These signs may be: °¨ Numbness of your taped toes. °¨ Coolness of your taped toes. °¨ Color change in the area beyond the tape. °¨ Increased pain. °· If you have any of these signs, loosen or rewrap the tape. If you need to loosen or rewrap the buddy tape, make sure you use the padding again. °SEEK IMMEDIATE MEDICAL CARE IF:  °· You have worse pain, swelling, inflammation (soreness), drainage or bleeding after you rewrap the tape. °· Any new problems occur. °MAKE SURE YOU:  °· Understand these instructions. °· Will watch your condition. °· Will get help right away if you are not doing well or get worse. °Document Released: 08/31/2004 Document Revised: 02/19/2012 Document Reviewed: 11/24/2008 °ExitCare® Patient Information ©2015 [employer]. This information is not intended to replace advice given to you by your health care provider. Make sure you discuss any questions you have with your health care provider. ° °Contusion °A contusion is a deep bruise. Contusions happen when an injury causes bleeding under the skin. Signs of bruising include pain, puffiness (swelling), and discolored skin. The contusion may turn blue, purple, or yellow. °HOME CARE  °· Put ice on the  injured area. °¨ Put ice in a plastic bag. °¨ Place a towel between your skin and the bag. °¨ Leave the ice on for 15-20 minutes, 03-04 times a day. °· Only take medicine as told by your doctor. °· Rest the injured area. °· If possible, raise (elevate) the injured area to lessen puffiness. °GET HELP RIGHT AWAY IF:  °· You have more bruising or puffiness. °· You have pain that is getting worse. °· Your puffiness or pain is not helped by medicine. °MAKE SURE YOU:  °· Understand these instructions. °· Will watch your condition. °· Will get help right away if you are not doing well or get worse. °Document Released: 05/15/2008 Document Revised: 02/19/2012 Document Reviewed: 10/02/2011 °ExitCare® Patient Information ©2015 [employer]. This information is not intended to replace advice given to you by your health care provider. Make sure you discuss any questions you have with your health care provider. ° °

## 2014-08-06 ENCOUNTER — Emergency Department (HOSPITAL_COMMUNITY)
Admission: EM | Admit: 2014-08-06 | Discharge: 2014-08-06 | Disposition: A | Discharge status: Home / Self Care | Payer: No Typology Code available for payment source | Attending: Emergency Medicine | Admitting: Emergency Medicine

## 2014-08-06 ENCOUNTER — Encounter (HOSPITAL_COMMUNITY): Payer: Self-pay | Admitting: Emergency Medicine

## 2014-08-06 ENCOUNTER — Emergency Department (HOSPITAL_COMMUNITY): Payer: No Typology Code available for payment source

## 2014-08-06 DIAGNOSIS — Z79899 Other long term (current) drug therapy: Secondary | ICD-10-CM | POA: Insufficient documentation

## 2014-08-06 DIAGNOSIS — Z791 Long term (current) use of non-steroidal anti-inflammatories (NSAID): Secondary | ICD-10-CM | POA: Diagnosis not present

## 2014-08-06 DIAGNOSIS — S92919A Unspecified fracture of unspecified toe(s), initial encounter for closed fracture: Secondary | ICD-10-CM | POA: Insufficient documentation

## 2014-08-06 DIAGNOSIS — S99929A Unspecified injury of unspecified foot, initial encounter: Secondary | ICD-10-CM

## 2014-08-06 DIAGNOSIS — S92912A Unspecified fracture of left toe(s), initial encounter for closed fracture: Secondary | ICD-10-CM

## 2014-08-06 DIAGNOSIS — Y9389 Activity, other specified: Secondary | ICD-10-CM | POA: Insufficient documentation

## 2014-08-06 DIAGNOSIS — S99919A Unspecified injury of unspecified ankle, initial encounter: Secondary | ICD-10-CM | POA: Diagnosis present

## 2014-08-06 DIAGNOSIS — S8990XA Unspecified injury of unspecified lower leg, initial encounter: Secondary | ICD-10-CM | POA: Diagnosis present

## 2014-08-06 DIAGNOSIS — Y9289 Other specified places as the place of occurrence of the external cause: Secondary | ICD-10-CM | POA: Diagnosis not present

## 2014-08-06 DIAGNOSIS — W2209XA Striking against other stationary object, initial encounter: Secondary | ICD-10-CM | POA: Diagnosis not present

## 2014-08-06 DIAGNOSIS — F17200 Nicotine dependence, unspecified, uncomplicated: Secondary | ICD-10-CM | POA: Diagnosis not present

## 2014-08-06 NOTE — Discharge Instructions (Signed)

## 2014-08-06 NOTE — ED Notes (Signed)
4th toe on l/foot is swollen and painful. Pt reports bumping toe one week ago. Tx with warm soaks

## 2014-08-06 NOTE — ED Notes (Signed)
XRAY PENDING

## 2014-08-06 NOTE — ED Notes (Signed)
Pt was seen on 8/20 at Urgent Care for same concern

## 2014-08-06 NOTE — ED Provider Notes (Signed)
CSN: [id number]     Arrival date & time 08/06/14  1331 History  This chart was scribed for non-physician practitioner, Arthor Captain PA-C, working with Glynn Octave, MD by Milly Jakob, ED Scribe. The patient was seen in room WTR6/WTR6. Patient's care was started at 2:08 PM.   Chief Complaint  Patient presents with  . Toe Injury    swollen toe l/foot-4th toe   The history is provided by the patient. No language interpreter was used.   HPI Comments: Felicia Lawson is a 58 y.o. female who presents to the Emergency Department complaining of gradually worsening pain and swelling in her left 4th toe after an injury 1 week ago. She reports that she hit it against her bed after going to the bathroom during the night. She reports using OTC medication and a cold compress and minimal relief. She states that the pain is exacerbated by standing. She denies difficulty walking. She reports being seen at urgent care for this the day after injury, and they did not take an X-Ray.  History reviewed. No pertinent past medical history. Past Surgical History  Procedure Laterality Date  . Cesarean section    . Tubal ligation     Family History  Problem Relation Age of Onset  . Diabetes Mother   . Diabetes Sister   . Hypertension Sister    History  Substance Use Topics  . Smoking status: Current Every Day Smoker -- 0.50 packs/day  . Smokeless tobacco: Not on file  . Alcohol Use: No   OB History   Grav Para Term Preterm Abortions TAB SAB Ect Mult Living   0 1 1 0 0 0 6     Review of Systems Ten systems reviewed and are negative for acute change, except as noted in the HPI.   Allergies  Review of patient's allergies indicates no known allergies.  Home Medications   Prior to Admission medications   Medication Sig Start Date End Date Taking? Authorizing Provider  fluconazole (DIFLUCAN) 150 MG tablet Take 1 tablet (150 mg total) by mouth once. 05/16/14   Aviva Signs, CNM  Ibuprofen  (MIDOL PO) Take 1 tablet by mouth daily as needed.    Historical Provider, MD  sulfamethoxazole-trimethoprim (BACTRIM DS) 800-160 MG per tablet Take 1 tablet by mouth 2 (two) times daily. 05/16/14   Aviva Signs, CNM   Triage Vitals: BP 115/63  Pulse 87  Temp(Src) 99.4 F (37.4 C) (Oral)  Resp 18  SpO2 97% Physical Exam  Nursing note and vitals reviewed. Constitutional: She is oriented to person, place, and time. She appears well-developed and well-nourished. No distress.  HENT:  Head: Normocephalic and atraumatic.  Eyes: Conjunctivae and EOM are normal.  Neck: Neck supple. No tracheal deviation present.  Cardiovascular: Normal rate.   Pulmonary/Chest: Effort normal. No respiratory distress.  Musculoskeletal: Normal range of motion. She exhibits edema and tenderness.  Trace pitting edema in the left foot. Tender on the 4th metatarsal and the 5th metatarsal. 4th toe on the left foot has significant swell, some deformity,and ecchymosis on the medial side. She can move the toe.  Neurological: She is alert and oriented to person, place, and time.  Skin: Skin is warm and dry.  Psychiatric: She has a normal mood and affect. Her behavior is normal.    ED Course  Procedures (including critical care time) DIAGNOSTIC STUDIES: Oxygen Saturation is 97% on room air, normal by my interpretation.    COORDINATION OF CARE:  2:12 PM-Discussed treatment plan which includes xray with pt at bedside and pt agreed to plan.   Labs Review Labs Reviewed - No data to display  Imaging Review No results found.   EKG Interpretation None      MDM   Final diagnoses:  Toe fracture, left, closed, initial encounter    Patient seen in shared visit with attending physician.  patient has a fractured and displaced 4th phalanx.  We will give her a post op shoe. F/u with orhto. .I personally reviewed the imaging tests through PACS system. I have reviewed and interpreted Lab values. I reviewed  available ER/hospitalization records through the EMR   I personally performed the services described in this documentation, which was scribed in my presence. The recorded information has been reviewed and is accurate.   Arthor Captain, PA-C 08/16/14 2145

## 2014-08-17 ENCOUNTER — Emergency Department (HOSPITAL_COMMUNITY)
Admission: EM | Admit: 2014-08-17 | Discharge: 2014-08-17 | Discharge status: Against Medical Advice | Payer: No Typology Code available for payment source

## 2014-08-17 NOTE — ED Provider Notes (Signed)
Medical screening examination/treatment/procedure(s) were conducted as a shared visit with non-physician practitioner(s) and myself.  I personally evaluated the patient during the encounter.   L 4th toe pain and swelling after trauma. Neurovascularly intact.   EKG Interpretation None       Glynn Octave, MD 08/17/14 [phone]

## 2014-08-17 NOTE — ED Notes (Signed)
Called for triage x4. Patient is not in lobby.

## 2014-10-12 ENCOUNTER — Encounter (HOSPITAL_COMMUNITY): Payer: Self-pay | Admitting: Emergency Medicine

## 2015-01-18 ENCOUNTER — Inpatient Hospital Stay (HOSPITAL_COMMUNITY)
Admission: AD | Admit: 2015-01-18 | Discharge: 2015-01-18 | Discharge status: Against Medical Advice | Payer: 59 | Source: Ambulatory Visit | Attending: Obstetrics & Gynecology | Admitting: Obstetrics & Gynecology

## 2015-01-18 ENCOUNTER — Encounter (HOSPITAL_COMMUNITY): Payer: Self-pay | Admitting: *Deleted

## 2015-01-18 ENCOUNTER — Inpatient Hospital Stay (EMERGENCY_DEPARTMENT_HOSPITAL)
Admission: AD | Admit: 2015-01-18 | Discharge: 2015-01-18 | Disposition: A | Discharge status: Home / Self Care | Payer: 59 | Source: Ambulatory Visit | Attending: Obstetrics & Gynecology | Admitting: Obstetrics & Gynecology

## 2015-01-18 DIAGNOSIS — A5901 Trichomonal vulvovaginitis: Secondary | ICD-10-CM | POA: Diagnosis not present

## 2015-01-18 DIAGNOSIS — F17210 Nicotine dependence, cigarettes, uncomplicated: Secondary | ICD-10-CM | POA: Insufficient documentation

## 2015-01-18 DIAGNOSIS — A599 Trichomoniasis, unspecified: Secondary | ICD-10-CM

## 2015-01-18 DIAGNOSIS — N898 Other specified noninflammatory disorders of vagina: Secondary | ICD-10-CM | POA: Diagnosis present

## 2015-01-18 HISTORY — DX: Trichomoniasis, unspecified: A59.9

## 2015-01-18 LAB — WET PREP, GENITAL
CLUE CELLS WET PREP: NONE SEEN
Yeast Wet Prep HPF POC: NONE SEEN

## 2015-01-18 LAB — URINALYSIS, ROUTINE W REFLEX MICROSCOPIC
BILIRUBIN URINE: NEGATIVE
Glucose, UA: NEGATIVE mg/dL
KETONES UR: NEGATIVE mg/dL
Nitrite: NEGATIVE
Protein, ur: NEGATIVE mg/dL
Specific Gravity, Urine: 1.02 (ref 1.005–1.030)
Urobilinogen, UA: 0.2 mg/dL (ref 0.0–1.0)
pH: 7 (ref 5.0–8.0)

## 2015-01-18 LAB — URINE MICROSCOPIC-ADD ON

## 2015-01-18 MED ORDER — METRONIDAZOLE 500 MG PO TABS
2000.0000 mg | ORAL_TABLET | Freq: Once | ORAL | Status: AC
  Administered 2015-01-18: 2000 mg via ORAL
  Filled 2015-01-18: qty 4

## 2015-01-18 NOTE — MAU Provider Note (Signed)
History     CSN: [id number]  Arrival date and time: 01/18/15 1224   First Provider Initiated Contact with Patient 01/18/15 1315      Chief Complaint  Patient presents with  . Vaginal Discharge   HPI   Felicia Lawson is a 59 y.o. female presenting to the MAU with a chief complaint of vaginal discharge. Pt reports she has had the discharge for one week, and has tried OTC yeast infection treatment with no relief. Pt describes the discharge as copious and originally white, but now yellow, with no associated odor. Pt has one sexual partner, does not use protection and is requesting STD testing today. Pt went through menopause at age 60. Denies any vaginal bleeding, dysuria, pruritis, cramping, abdominal pain, or pain with intercourse. Pt reports a normal pap 2 years ago.  Pt returned to MAU for bimanual exam.  OB History    Gravida Para Term Preterm AB TAB SAB Ectopic Multiple Living   0 1 1 0 0 0 6      Past Medical History  Diagnosis Date  . Trichomonas infection     Past Surgical History  Procedure Laterality Date  . Cesarean section    . Tubal ligation      Family History  Problem Relation Age of Onset  . Diabetes Mother   . Diabetes Sister   . Hypertension Sister     History  Substance Use Topics  . Smoking status: Current Every Day Smoker -- 0.50 packs/day  . Smokeless tobacco: Not on file  . Alcohol Use: No    Allergies: No Known Allergies  Prescriptions prior to admission  Medication Sig Dispense Refill Last Dose  . fluconazole (DIFLUCAN) 150 MG tablet Take 1 tablet (150 mg total) by mouth once. (Patient not taking: Reported on 01/18/2015) 1 tablet 1 Unknown at Unknown time  . sulfamethoxazole-trimethoprim (BACTRIM DS) 800-160 MG per tablet Take 1 tablet by mouth 2 (two) times daily. (Patient not taking: Reported on 01/18/2015) 6 tablet 0 Unknown at Unknown time   Results for orders placed or performed during the hospital encounter of 01/18/15 (from the  past 48 hour(s))  Urinalysis, Routine w reflex microscopic     Status: Abnormal   Collection Time: 01/18/15 10:07 AM  Result Value Ref Range   Color, Urine YELLOW YELLOW   APPearance HAZY (A) CLEAR   Specific Gravity, Urine 1.020 1.005 - 1.030   pH 7.0 5.0 - 8.0   Glucose, UA NEGATIVE NEGATIVE mg/dL   Hgb urine dipstick SMALL (A) NEGATIVE   Bilirubin Urine NEGATIVE NEGATIVE   Ketones, ur NEGATIVE NEGATIVE mg/dL   Protein, ur NEGATIVE NEGATIVE mg/dL   Urobilinogen, UA 0.2 0.0 - 1.0 mg/dL   Nitrite NEGATIVE NEGATIVE   Leukocytes, UA LARGE (A) NEGATIVE  Urine microscopic-add on     Status: Abnormal   Collection Time: 01/18/15 10:07 AM  Result Value Ref Range   Squamous Epithelial / LPF FEW (A) RARE   WBC, UA 0-2 <3 WBC/hpf   RBC / HPF 0-2 <3 RBC/hpf   Bacteria, UA FEW (A) RARE  Wet prep, genital     Status: Abnormal   Collection Time: 01/18/15 10:40 AM  Result Value Ref Range   Yeast Wet Prep HPF POC NONE SEEN NONE SEEN   Trich, Wet Prep FEW (A) NONE SEEN   Clue Cells Wet Prep HPF POC NONE SEEN NONE SEEN   WBC, Wet Prep HPF POC TOO NUMEROUS TO COUNT (A) NONE SEEN  Comment: BACTERIA- TOO NUMEROUS TO COUNT    Review of Systems  Constitutional: Negative for fever and chills.  Gastrointestinal: Negative for abdominal pain.  Genitourinary:       + vaginal discharge    Physical Exam   Blood pressure 147/83, pulse 85, temperature 97.8 F (36.6 C), temperature source Oral, resp. rate 18.  Physical Exam  Constitutional: She is oriented to person, place, and time. She appears well-developed and well-nourished. No distress.  Neurological: She is alert and oriented to person, place, and time.  Skin: Skin is warm. She is not diaphoretic.  Psychiatric: Her behavior is normal.    Speculum: deferred Vagina:yellow discharge visible on inspection, no lesions or erythema Cervix: closed Bimanual: no cervical motion tenderness, no adnexal tenderness  MAU Course   Procedures None    MDM See previous note  Assessment and Plan  A- Trichimonas   P- Pt counseled on trichimonas     Advised to have her partner tested and abstain from sex until 1 wk after end of treatment course     Flagyl 2 grams given in MAU     Iona HansenJennifer Irene Welton Bord, NP 01/18/2015 1:39 PM

## 2015-01-18 NOTE — MAU Note (Signed)
Pt has one sexual partner but recently found out he has other partners.

## 2015-01-18 NOTE — MAU Note (Signed)
Patient was seen in MAU earlier but had to leave. Now has returned.

## 2015-01-18 NOTE — MAU Note (Signed)
Heavy discharge, has been on PCN, thinks she has yeast infection, took one-day dose of OTC yeast medication but still has discharge.

## 2015-01-18 NOTE — MAU Provider Note (Signed)
History     CSN: [id number]  Arrival date and time: 01/18/15 4098   None     Chief Complaint  Patient presents with  . Vaginal Discharge   HPI   Felicia Lawson is a 59 y.o. female presenting to the MAU with a chief complaint of vaginal discharge. Pt reports she has had the discharge for one week, and has tried OTC yeast infection treatment with no relief. Pt describes the discharge as copious and originally white, but now yellow, with no associated odor. Pt has one sexual partner, does not use protection and is requesting STD testing today. Pt went through menopause at age 1. Denies any vaginal bleeding, dysuria, pruritis, cramping, abdominal pain, or pain with intercourse. Pt reports a normal pap 2 years ago.  OB History    Gravida Para Term Preterm AB TAB SAB Ectopic Multiple Living   0 1 1 0 0 0 6      Past Medical History  Diagnosis Date  . Medical history non-contributory     Past Surgical History  Procedure Laterality Date  . Cesarean section    . Tubal ligation      Family History  Problem Relation Age of Onset  . Diabetes Mother   . Diabetes Sister   . Hypertension Sister     History  Substance Use Topics  . Smoking status: Current Every Day Smoker -- 0.50 packs/day  . Smokeless tobacco: Not on file  . Alcohol Use: No    Allergies: No Known Allergies  Prescriptions prior to admission  Medication Sig Dispense Refill Last Dose  . fluconazole (DIFLUCAN) 150 MG tablet Take 1 tablet (150 mg total) by mouth once. 1 tablet 1 Unknown at Unknown time  . Ibuprofen (MIDOL PO) Take 1 tablet by mouth daily as needed.   Unknown at Unknown time  . sulfamethoxazole-trimethoprim (BACTRIM DS) 800-160 MG per tablet Take 1 tablet by mouth 2 (two) times daily. 6 tablet 0 Unknown at Unknown time   Results for orders placed or performed during the hospital encounter of 01/18/15 (from the past 48 hour(s))  Urinalysis, Routine w reflex microscopic     Status: Abnormal    Collection Time: 01/18/15 10:07 AM  Result Value Ref Range   Color, Urine YELLOW YELLOW   APPearance HAZY (A) CLEAR   Specific Gravity, Urine 1.020 1.005 - 1.030   pH 7.0 5.0 - 8.0   Glucose, UA NEGATIVE NEGATIVE mg/dL   Hgb urine dipstick SMALL (A) NEGATIVE   Bilirubin Urine NEGATIVE NEGATIVE   Ketones, ur NEGATIVE NEGATIVE mg/dL   Protein, ur NEGATIVE NEGATIVE mg/dL   Urobilinogen, UA 0.2 0.0 - 1.0 mg/dL   Nitrite NEGATIVE NEGATIVE   Leukocytes, UA LARGE (A) NEGATIVE  Urine microscopic-add on     Status: Abnormal   Collection Time: 01/18/15 10:07 AM  Result Value Ref Range   Squamous Epithelial / LPF FEW (A) RARE   WBC, UA 0-2 <3 WBC/hpf   RBC / HPF 0-2 <3 RBC/hpf   Bacteria, UA FEW (A) RARE  Wet prep, genital     Status: Abnormal   Collection Time: 01/18/15 10:40 AM  Result Value Ref Range   Yeast Wet Prep HPF POC NONE SEEN NONE SEEN   Trich, Wet Prep FEW (A) NONE SEEN   Clue Cells Wet Prep HPF POC NONE SEEN NONE SEEN   WBC, Wet Prep HPF POC TOO NUMEROUS TO COUNT (A) NONE SEEN    Comment: BACTERIA- TOO NUMEROUS TO  COUNT    Review of Systems  Constitutional: Negative for fever and chills.  Gastrointestinal: Negative for nausea, vomiting, abdominal pain, diarrhea and constipation.  Genitourinary: Negative for dysuria and hematuria.   Physical Exam   Blood pressure 139/79, pulse 87, temperature 98.7 F (37.1 C), temperature source Oral, resp. rate 18.  Physical Exam  Constitutional: She appears well-developed and well-nourished. No distress.  HENT:  Head: Normocephalic.  Neck: Normal range of motion.  Cardiovascular: Normal rate and regular rhythm.   Respiratory: Effort normal and breath sounds normal.  Skin: She is not diaphoretic.    MAU Course  Procedures  None  MDM Wet prep and GC collected by RN Patient states she has to leave AMA. Patient states she will come back to be examined by NP but could not stay at this time.   Assessment and Plan   A: Trichomonas Patient left AMA  Iona HansenJennifer Irene Esker Dever, NP 01/18/2015 11:47 AM

## 2015-01-18 NOTE — Discharge Instructions (Signed)
Sexually Transmitted Disease °A sexually transmitted disease (STD) is a disease or infection that may be passed (transmitted) from person to person, usually during sexual activity. This may happen by way of saliva, semen, blood, vaginal mucus, or urine. Common STDs include:  °· Gonorrhea.   °· Chlamydia.   °· Syphilis.   °· HIV and AIDS.   °· Genital herpes.   °· Hepatitis B and C.   °· Trichomonas.   °· Human papillomavirus (HPV).   °· Pubic lice.   °· Scabies. °· Mites. °· Bacterial vaginosis. °WHAT ARE CAUSES OF STDs? °An STD may be caused by bacteria, a virus, or parasites. STDs are often transmitted during sexual activity if one person is infected. However, they may also be transmitted through nonsexual means. STDs may be transmitted after:  °· Sexual intercourse with an infected person.   °· Sharing sex toys with an infected person.   °· Sharing needles with an infected person or using unclean piercing or tattoo needles. °· Having intimate contact with the genitals, mouth, or rectal areas of an infected person.   °· Exposure to infected fluids during birth. °WHAT ARE THE SIGNS AND SYMPTOMS OF STDs? °Different STDs have different symptoms. Some people may not have any symptoms. If symptoms are present, they may include:  °· Painful or bloody urination.   °· Pain in the pelvis, abdomen, vagina, anus, throat, or eyes.   °· A skin rash, itching, or irritation. °· Growths, ulcerations, blisters, or sores in the genital and anal areas. °· Abnormal vaginal discharge with or without bad odor.   °· Penile discharge in men.   °· Fever.   °· Pain or bleeding during sexual intercourse.   °· Swollen glands in the groin area.   °· Yellow skin and eyes (jaundice). This is seen with hepatitis.   °· Swollen testicles. °· Infertility. °· Sores and blisters in the mouth. °HOW ARE STDs DIAGNOSED? °To make a diagnosis, your health care provider may:  °· Take a medical history.   °· Perform a physical exam.   °· Take a sample of  any discharge to examine. °· Swab the throat, cervix, opening to the penis, rectum, or vagina for testing. °· Test a sample of your first morning urine.   °· Perform blood tests.   °· Perform a Pap test, if this applies.   °· Perform a colposcopy.   °· Perform a laparoscopy.   °HOW ARE STDs TREATED? ° Treatment depends on the STD. Some STDs may be treated but not cured.  °· Chlamydia, gonorrhea, trichomonas, and syphilis can be cured with antibiotic medicine.   °· Genital herpes, hepatitis, and HIV can be treated, but not cured, with prescribed medicines. The medicines lessen symptoms.   °· Genital warts from HPV can be treated with medicine or by freezing, burning (electrocautery), or surgery. Warts may come back.   °· HPV cannot be cured with medicine or surgery. However, abnormal areas may be removed from the cervix, vagina, or vulva.   °· If your diagnosis is confirmed, your recent sexual partners need treatment. This is true even if they are symptom-free or have a negative culture or evaluation. They should not have sex until their health care providers say it is okay. °HOW CAN I REDUCE MY RISK OF GETTING AN STD? °Take these steps to reduce your risk of getting an STD: °· Use latex condoms, dental dams, and water-soluble lubricants during sexual activity. Do not use petroleum jelly or oils. °· Avoid having multiple sex partners. °· Do not have sex with someone who has other sex partners. °· Do not have sex with anyone you do not know or who is at   high risk for an STD. °· Avoid risky sex practices that can break your skin. °· Do not have sex if you have open sores on your mouth or skin. °· Avoid drinking too much alcohol or taking illegal drugs. Alcohol and drugs can affect your judgment and put you in a vulnerable position. °· Avoid engaging in oral and anal sex acts. °· Get vaccinated for HPV and hepatitis. If you have not received these vaccines in the past, talk to your health care provider about whether one  or both might be right for you.   °· If you are at risk of being infected with HIV, it is recommended that you take a prescription medicine daily to prevent HIV infection. This is called pre-exposure prophylaxis (PrEP). You are considered at risk if: °· You are a man who has sex with other men (MSM). °· You are a heterosexual man or woman and are sexually active with more than one partner. °· You take drugs by injection. °· You are sexually active with a partner who has HIV. °· Talk with your health care provider about whether you are at high risk of being infected with HIV. If you choose to begin PrEP, you should first be tested for HIV. You should then be tested every 3 months for as long as you are taking PrEP.   °WHAT SHOULD I DO IF I THINK I HAVE AN STD? °· See your health care provider.   °· Tell your sexual partner(s). They should be tested and treated for any STDs. °· Do not have sex until your health care provider says it is okay.  °WHEN SHOULD I GET IMMEDIATE MEDICAL CARE? °Contact your health care provider right away if:  °· You have severe abdominal pain. °· You are a man and notice swelling or pain in your testicles. °· You are a woman and notice swelling or pain in your vagina. °Document Released: 02/17/2003 Document Revised: 12/02/2013 Document Reviewed: 06/17/2013 °ExitCare® Patient Information ©2015 [employer]. This information is not intended to replace advice given to you by your health care provider. Make sure you discuss any questions you have with your health care provider. ° °Trichomoniasis °Trichomoniasis is an infection caused by an organism called Trichomonas. The infection can affect both women and men. In women, the outer female genitalia and the vagina are affected. In men, the penis is mainly affected, but the prostate and other reproductive organs can also be involved. Trichomoniasis is a sexually transmitted infection (STI) and is most often passed to another person through sexual  contact.  °RISK FACTORS °· Having unprotected sexual intercourse. °· Having sexual intercourse with an infected partner. °SIGNS AND SYMPTOMS  °Symptoms of trichomoniasis in women include: °· Abnormal gray-green frothy vaginal discharge. °· Itching and irritation of the vagina. °· Itching and irritation of the area outside the vagina. °Symptoms of trichomoniasis in men include:  °· Penile discharge with or without pain. °· Pain during urination. This results from inflammation of the urethra. °DIAGNOSIS  °Trichomoniasis may be found during a Pap test or physical exam. Your health care provider may use one of the following methods to help diagnose this infection: °· Examining vaginal discharge under a microscope. For men, urethral discharge would be examined. °· Testing the pH of the vagina with a test tape. °· Using a vaginal swab test that checks for the Trichomonas organism. A test is available that provides results within a few minutes. °· Doing a culture test for the organism. This is not usually needed. °  TREATMENT  °· You may be given medicine to fight the infection. Women should inform their health care provider if they could be or are pregnant. Some medicines used to treat the infection should not be taken during pregnancy. °· Your health care provider may recommend over-the-counter medicines or creams to decrease itching or irritation. °· Your sexual partner will need to be treated if infected. °HOME CARE INSTRUCTIONS  °· Take medicines only as directed by your health care provider. °· Take over-the-counter medicine for itching or irritation as directed by your health care provider. °· Do not have sexual intercourse while you have the infection. °· Women should not douche or wear tampons while they have the infection. °· Discuss your infection with your partner. Your partner may have gotten the infection from you, or you may have gotten it from your partner. °· Have your sex partner get examined and treated if  necessary. °· Practice safe, informed, and protected sex. °· See your health care provider for other STI testing. °SEEK MEDICAL CARE IF:  °· You still have symptoms after you finish your medicine. °· You develop abdominal pain. °· You have pain when you urinate. °· You have bleeding after sexual intercourse. °· You develop a rash. °· Your medicine makes you sick or makes you throw up (vomit). °MAKE SURE YOU: °· Understand these instructions. °· Will watch your condition. °· Will get help right away if you are not doing well or get worse. °Document Released: 05/23/2001 Document Revised: 04/13/2014 Document Reviewed: 09/08/2013 °ExitCare® Patient Information ©2015 [employer]. This information is not intended to replace advice given to you by your health care provider. Make sure you discuss any questions you have with your health care provider. ° °

## 2015-01-19 LAB — GC/CHLAMYDIA PROBE AMP (~~LOC~~) NOT AT ARMC
CHLAMYDIA, DNA PROBE: NEGATIVE
Neisseria Gonorrhea: NEGATIVE

## 2015-01-19 LAB — HIV ANTIBODY (ROUTINE TESTING W REFLEX): HIV Screen 4th Generation wRfx: NONREACTIVE

## 2016-03-06 ENCOUNTER — Emergency Department (HOSPITAL_COMMUNITY)
Admission: EM | Admit: 2016-03-06 | Discharge: 2016-03-06 | Disposition: A | Discharge status: Home / Self Care | Payer: Self-pay | Source: Home / Self Care

## 2016-03-06 NOTE — ED Notes (Signed)
No answer in lobby.

## 2016-03-06 NOTE — ED Notes (Signed)
Patient  Was called twice in lobby- no response

## 2016-08-17 ENCOUNTER — Ambulatory Visit (HOSPITAL_COMMUNITY)
Admission: EM | Admit: 2016-08-17 | Discharge: 2016-08-17 | Discharge status: Against Medical Advice | Payer: BLUE CROSS/BLUE SHIELD

## 2016-08-17 NOTE — ED Notes (Signed)
Called patient in the waiting room, no response

## 2016-08-18 ENCOUNTER — Ambulatory Visit (HOSPITAL_COMMUNITY)
Admission: EM | Admit: 2016-08-18 | Discharge: 2016-08-18 | Disposition: A | Discharge status: Home / Self Care | Payer: BLUE CROSS/BLUE SHIELD | Attending: Family Medicine | Admitting: Family Medicine

## 2016-08-18 ENCOUNTER — Encounter (HOSPITAL_COMMUNITY): Payer: Self-pay | Admitting: Emergency Medicine

## 2016-08-18 ENCOUNTER — Ambulatory Visit (INDEPENDENT_AMBULATORY_CARE_PROVIDER_SITE_OTHER): Payer: BLUE CROSS/BLUE SHIELD

## 2016-08-18 DIAGNOSIS — J431 Panlobular emphysema: Secondary | ICD-10-CM

## 2016-08-18 MED ORDER — LEVOFLOXACIN 500 MG PO TABS: 500.0000 mg | ORAL_TABLET | Freq: Every day | ORAL | 0 refills | Status: DC

## 2016-08-18 MED ORDER — DICLOFENAC POTASSIUM 50 MG PO TABS: 50.0000 mg | ORAL_TABLET | Freq: Three times a day (TID) | ORAL | 0 refills | Status: DC

## 2016-08-18 NOTE — ED Triage Notes (Signed)
Pt has been suffering from a cough for about two weeks. In the last four days she has developed some soreness and tenderness on her right flank to under her right breast, lower bilateral back pain that radiates to the hips, and central chest muscle tightness.  Pt states they all feel worse when she coughs.

## 2016-08-18 NOTE — ED Provider Notes (Signed)
[facility]    CSN: [id number] Arrival date & time: 08/18/16  1039  First Provider Contact:  First MD Initiated Contact with Patient 08/18/16 1137        History   Chief Complaint Chief Complaint  Patient presents with  . Cough  . ribcage pain    right lower  . Back Pain    HPI Felicia Lawson is a 60 y.o. female.   The history is provided by the patient.  Cough  Cough characteristics:  Productive Severity:  Moderate Onset quality:  Gradual Duration:  2 weeks Progression:  Worsening Chronicity:  New Smoker: yes   Context: smoke exposure   Associated symptoms: chest pain, fever, myalgias and shortness of breath   Associated symptoms: no wheezing   Back Pain  Associated symptoms: chest pain and fever     Past Medical History:  Diagnosis Date  . Trichomonas infection     There are no active problems to display for this patient.   Past Surgical History:  Procedure Laterality Date  . CESAREAN SECTION    . TUBAL LIGATION      OB History    Gravida Para Term Preterm AB Living   9 8 8  0 1 6   SAB TAB Ectopic Multiple Live Births   0 1 0 0         Home Medications    Prior to Admission medications   Not on File    Family History Family History  Problem Relation Age of Onset  . Diabetes Mother   . Diabetes Sister   . Hypertension Sister     Social History Social History  Substance Use Topics  . Smoking status: Current Every Day Smoker    Packs/day: 0.50  . Smokeless tobacco: Not on file  . Alcohol use No     Allergies   Review of patient's allergies indicates no known allergies.   Review of Systems Review of Systems  Constitutional: Positive for fever.  HENT: Negative.   Respiratory: Positive for cough and shortness of breath. Negative for wheezing.   Cardiovascular: Positive for chest pain. Negative for palpitations and leg swelling.  Musculoskeletal: Positive for back pain and myalgias.  All other systems reviewed and  are negative.    Physical Exam Triage Vital Signs ED Triage Vitals  Enc Vitals Group     BP 08/18/16 1130 134/83     Pulse Rate 08/18/16 1130 75     Resp 08/18/16 1130 16     Temp 08/18/16 1130 98.4 F (36.9 C)     Temp Source 08/18/16 1130 Oral     SpO2 08/18/16 1130 96 %     Weight --      Height --      Head Circumference --      Peak Flow --      Pain Score 08/18/16 1131 9     Pain Loc --      Pain Edu? --      Excl. in GC? --    No data found.   Updated Vital Signs BP 134/83 (BP Location: Left Arm)   Pulse 75   Temp 98.4 F (36.9 C) (Oral)   Resp 16   SpO2 96%   Visual Acuity Right Eye Distance:   Left Eye Distance:   Bilateral Distance:    Right Eye Near:   Left Eye Near:    Bilateral Near:     Physical Exam  Constitutional: She is oriented to  person, place, and time. She appears well-developed and well-nourished.  Neck: Normal range of motion. Neck supple.  Cardiovascular: Regular rhythm and intact distal pulses.   Pulmonary/Chest: Effort normal. She has decreased breath sounds. She has rhonchi.  Abdominal: Soft. Bowel sounds are normal.  Musculoskeletal: She exhibits no edema.  Lymphadenopathy:    She has no cervical adenopathy.  Neurological: She is alert and oriented to person, place, and time.  Skin: Skin is warm and dry.  Nursing note and vitals reviewed.    UC Treatments / Results  Labs (all labs ordered are listed, but only abnormal results are displayed) Labs Reviewed - No data to display  EKG  EKG Interpretation None       Radiology No results found. X-rays reviewed and report per radiologist.  Procedures Procedures (including critical care time)  Medications Ordered in UC Medications - No data to display   Initial Impression / Assessment and Plan / UC Course  I have reviewed the triage vital signs and the nursing notes.  Pertinent labs & imaging results that were available during my care of the patient were reviewed  by me and considered in my medical decision making (see chart for details).  Clinical Course      Final Clinical Impressions(s) / UC Diagnoses   Final diagnoses:  None    New Prescriptions New Prescriptions   No medications on file     Linna Hoff, MD 08/18/16 1230

## 2016-08-18 NOTE — Discharge Instructions (Signed)
No more smoking, take all of medicine, drink lots of water.

## 2017-03-22 ENCOUNTER — Encounter (HOSPITAL_COMMUNITY): Payer: Self-pay | Admitting: Emergency Medicine

## 2017-03-22 ENCOUNTER — Ambulatory Visit (HOSPITAL_COMMUNITY)
Admission: EM | Admit: 2017-03-22 | Discharge: 2017-03-22 | Disposition: A | Discharge status: Home / Self Care | Payer: Commercial Managed Care - PPO | Attending: Family Medicine | Admitting: Family Medicine

## 2017-03-22 ENCOUNTER — Ambulatory Visit (INDEPENDENT_AMBULATORY_CARE_PROVIDER_SITE_OTHER): Payer: Commercial Managed Care - PPO

## 2017-03-22 DIAGNOSIS — J40 Bronchitis, not specified as acute or chronic: Secondary | ICD-10-CM

## 2017-03-22 DIAGNOSIS — R05 Cough: Secondary | ICD-10-CM

## 2017-03-22 DIAGNOSIS — R0602 Shortness of breath: Secondary | ICD-10-CM

## 2017-03-22 MED ORDER — LEVOFLOXACIN 500 MG PO TABS: 500.0000 mg | ORAL_TABLET | Freq: Every day | ORAL | 0 refills | Status: DC

## 2017-03-22 MED ORDER — PREDNISONE 20 MG PO TABS: ORAL_TABLET | ORAL | 0 refills | Status: DC

## 2017-03-22 MED ORDER — BENZONATATE 100 MG PO CAPS: 100.0000 mg | ORAL_CAPSULE | Freq: Three times a day (TID) | ORAL | 0 refills | Status: DC | PRN

## 2017-03-22 MED ORDER — ALBUTEROL SULFATE HFA 108 (90 BASE) MCG/ACT IN AERS: 2.0000 | INHALATION_SPRAY | RESPIRATORY_TRACT | 1 refills | Status: DC | PRN

## 2017-03-22 NOTE — ED Triage Notes (Signed)
Reports cough, congestion, chest heaviness for a month.  Patient has had a runny nose

## 2017-03-22 NOTE — Discharge Instructions (Signed)
You should be breathing more easily in the next 48 hours. Start the medicine tonight

## 2017-03-22 NOTE — ED Provider Notes (Addendum)
[facility]    CSN: [id number] Arrival date & time: 03/22/17  1812     History   Chief Complaint Chief Complaint  Patient presents with  . Cough    HPI Felicia Lawson is a 61 y.o. female.   This is 61 year old woman who smokes and has a cough and tightness in her chest with wheezing for the last month. She's had no hemoptysis. She is somewhat short of breath. She denies any history of asthma. She also denies fever.      Past Medical History:  Diagnosis Date  . Trichomonas infection     There are no active problems to display for this patient.   Past Surgical History:  Procedure Laterality Date  . CESAREAN SECTION    . TUBAL LIGATION      OB History    Gravida Para Term Preterm AB Living   0 1 6   SAB TAB Ectopic Multiple Live Births   0 1 0 0         Home Medications    Prior to Admission medications   Medication Sig Start Date End Date Taking? Authorizing Provider  albuterol (PROVENTIL HFA;VENTOLIN HFA) 108 (90 Base) MCG/ACT inhaler Inhale 2 puffs into the lungs every 4 (four) hours as needed for wheezing or shortness of breath (cough, shortness of breath or wheezing.). 03/22/17   Elvina Sidle, MD  benzonatate (TESSALON) 100 MG capsule Take 1-2 capsules (100-200 mg total) by mouth 3 (three) times daily as needed for cough. 03/22/17   Elvina Sidle, MD  levofloxacin (LEVAQUIN) 500 MG tablet Take 1 tablet (500 mg total) by mouth daily. 03/22/17   Elvina Sidle, MD  predniSONE (DELTASONE) 20 MG tablet Two daily with food 03/22/17   Elvina Sidle, MD    Family History Family History  Problem Relation Age of Onset  . Diabetes Mother   . Diabetes Sister   . Hypertension Sister     Social History Social History  Substance Use Topics  . Smoking status: Current Every Day Smoker    Packs/day: 0.50  . Smokeless tobacco: Not on file  . Alcohol use No     Allergies   Patient has no known allergies.   Review of Systems Review  of Systems  Constitutional: Negative.   HENT: Negative.   Respiratory: Positive for cough, shortness of breath and wheezing.   Cardiovascular: Negative.   Gastrointestinal: Negative.   Neurological: Negative.      Physical Exam Triage Vital Signs ED Triage Vitals  Enc Vitals Group     BP 03/22/17 1830 128/78     Pulse Rate 03/22/17 1830 (!) 130     Resp 03/22/17 1837 16     Temp 03/22/17 1830 99.9 F (37.7 C)     Temp Source 03/22/17 1830 Oral     SpO2 03/22/17 1830 99 %     Weight 03/22/17 1830 120 lb (54.4 kg)     Height --      Head Circumference --      Peak Flow --      Pain Score 03/22/17 1833 9     Pain Loc --      Pain Edu? --      Excl. in GC? --    No data found.   Updated Vital Signs BP (!) 143/90 (BP Location: Left Arm) Comment: notified rn  Pulse (!) 102 Comment: notified rn  Temp 98.8 F (37.1 C) (Oral)   Resp 16  SpO2 97%    Physical Exam  Constitutional: She is oriented to person, place, and time. She appears well-developed and well-nourished.  Very congested cough  Eyes: Conjunctivae are normal. Pupils are equal, round, and reactive to light.  Neck: Normal range of motion. Neck supple.  Cardiovascular: Normal rate, regular rhythm and normal heart sounds.   Pulmonary/Chest: Effort normal. She has wheezes.  Musculoskeletal: Normal range of motion.  Neurological: She is alert and oriented to person, place, and time.  Skin: Skin is warm and dry.  Nursing note and vitals reviewed.    UC Treatments / Results  Labs (all labs ordered are listed, but only abnormal results are displayed) Labs Reviewed - No data to display  EKG  EKG Interpretation None       Radiology Dg Chest 2 View  Result Date: 03/22/2017 CLINICAL DATA:  Chest pain, cough, shortness of breath, low-grade fever and night sweats. EXAM: CHEST  2 VIEW COMPARISON:  Chest radiograph August 18, 2016 FINDINGS: Cardiomediastinal silhouette is normal. No pleural effusions or  focal consolidations. Similar chronic interstitial change increased lung volumes. Reticulonodular densities project in LEFT lung base. Trachea projects midline and there is no pneumothorax. Biapical pleural thickening. Soft tissue planes and included osseous structures are non-suspicious. IMPRESSION: COPD. Reticulonodular densities LEFT lung base concerning for pneumonia. Followup PA and lateral chest X-ray is recommended in 3-4 weeks following trial of antibiotic therapy to ensure resolution and exclude underlying malignancy. Electronically Signed   By: Awilda Metro M.D.   On: 03/22/2017 19:31    Procedures Procedures (including critical care time)  Medications Ordered in UC Medications - No data to display   Initial Impression / Assessment and Plan / UC Course  I have reviewed the triage vital signs and the nursing notes.  Pertinent labs & imaging results that were available during my care of the patient were reviewed by me and considered in my medical decision making (see chart for details).     Final Clinical Impressions(s) / UC Diagnoses   Final diagnoses:  Bronchitis    New Prescriptions New Prescriptions   ALBUTEROL (PROVENTIL HFA;VENTOLIN HFA) 108 (90 BASE) MCG/ACT INHALER    Inhale 2 puffs into the lungs every 4 (four) hours as needed for wheezing or shortness of breath (cough, shortness of breath or wheezing.).   BENZONATATE (TESSALON) 100 MG CAPSULE    Take 1-2 capsules (100-200 mg total) by mouth 3 (three) times daily as needed for cough.   PREDNISONE (DELTASONE) 20 MG TABLET    Two daily with food  Patient told she needs follow-up x-ray in 3-4 weeks to ensure radiological improvement   Elvina Sidle, MD 03/22/17 1929    Elvina Sidle, MD 03/22/17 1940

## 2017-05-29 ENCOUNTER — Encounter (HOSPITAL_COMMUNITY): Payer: Self-pay | Admitting: Family Medicine

## 2017-05-29 ENCOUNTER — Ambulatory Visit (HOSPITAL_COMMUNITY)
Admission: EM | Admit: 2017-05-29 | Discharge: 2017-05-29 | Disposition: A | Discharge status: Home / Self Care | Payer: Commercial Managed Care - PPO | Attending: Family Medicine | Admitting: Family Medicine

## 2017-05-29 ENCOUNTER — Ambulatory Visit (HOSPITAL_COMMUNITY): Payer: Commercial Managed Care - PPO

## 2017-05-29 DIAGNOSIS — J40 Bronchitis, not specified as acute or chronic: Secondary | ICD-10-CM | POA: Diagnosis not present

## 2017-05-29 DIAGNOSIS — R05 Cough: Secondary | ICD-10-CM

## 2017-05-29 DIAGNOSIS — R059 Cough, unspecified: Secondary | ICD-10-CM

## 2017-05-29 MED ORDER — BENZONATATE 100 MG PO CAPS: 200.0000 mg | ORAL_CAPSULE | Freq: Three times a day (TID) | ORAL | 0 refills | Status: DC | PRN

## 2017-05-29 MED ORDER — LEVOFLOXACIN 500 MG PO TABS: 500.0000 mg | ORAL_TABLET | Freq: Every day | ORAL | 0 refills | Status: DC

## 2017-05-29 MED ORDER — ALBUTEROL SULFATE HFA 108 (90 BASE) MCG/ACT IN AERS: 2.0000 | INHALATION_SPRAY | RESPIRATORY_TRACT | 1 refills | Status: DC | PRN

## 2017-05-29 MED ORDER — METHYLPREDNISOLONE 4 MG PO TBPK: ORAL_TABLET | ORAL | 0 refills | Status: DC

## 2017-05-29 NOTE — ED Triage Notes (Signed)
Pt here for chest pain, coughing since yesterday. sts fever.

## 2017-05-29 NOTE — ED Provider Notes (Signed)
CSN: [id number]     Arrival date & time 05/29/17  1536 History   First MD Initiated Contact with Patient 05/29/17 1612     Chief Complaint  Patient presents with  . Cough  . Chest Pain   (Consider location/radiation/quality/duration/timing/severity/associated sxs/prior Treatment) Patient c/o chest discomfort and coughing.   The history is provided by the patient.  Cough  Cough characteristics:  Productive Sputum characteristics:  Nondescript Severity:  Moderate Onset quality:  Sudden Duration:  2 days Timing:  Constant Associated symptoms: chest pain   Chest Pain  Associated symptoms: cough     Past Medical History:  Diagnosis Date  . Trichomonas infection    Past Surgical History:  Procedure Laterality Date  . CESAREAN SECTION    . TUBAL LIGATION     Family History  Problem Relation Age of Onset  . Diabetes Mother   . Diabetes Sister   . Hypertension Sister    Social History  Substance Use Topics  . Smoking status: Current Every Day Smoker    Packs/day: 0.50  . Smokeless tobacco: Not on file  . Alcohol use No   OB History    Gravida Para Term Preterm AB Living   9 8 8  0 1 6   SAB TAB Ectopic Multiple Live Births   0 1 0 0       Review of Systems  Respiratory: Positive for cough.   Cardiovascular: Positive for chest pain.    Allergies  Patient has no known allergies.  Home Medications   Prior to Admission medications   Medication Sig Start Date End Date Taking? Authorizing Provider  albuterol (PROVENTIL HFA;VENTOLIN HFA) 108 (90 Base) MCG/ACT inhaler Inhale 2 puffs into the lungs every 4 (four) hours as needed for wheezing or shortness of breath (cough, shortness of breath or wheezing.). 05/29/17   Deatra Canter, FNP  benzonatate (TESSALON) 100 MG capsule Take 1-2 capsules (100-200 mg total) by mouth 3 (three) times daily as needed for cough. 03/22/17   Elvina Sidle, MD  benzonatate (TESSALON) 100 MG capsule Take 2 capsules (200 mg total) by  mouth 3 (three) times daily as needed for cough. 05/29/17   Deatra Canter, FNP  levofloxacin (LEVAQUIN) 500 MG tablet Take 1 tablet (500 mg total) by mouth daily. 05/29/17   Deatra Canter, FNP  methylPREDNISolone (MEDROL DOSEPAK) 4 MG TBPK tablet Take 6-5-4-3-2-1 po qd 05/29/17   Deatra Canter, FNP  predniSONE (DELTASONE) 20 MG tablet Two daily with food 03/22/17   Elvina Sidle, MD   Meds Ordered and Administered this Visit  Medications - No data to display  BP 129/79   Pulse (!) 101   Temp 100.1 F (37.8 C)   Resp 18   SpO2 97%  No data found.   Physical Exam  Urgent Care Course     Procedures (including critical care time)  Labs Review Labs Reviewed - No data to display  Imaging Review No results found.   Visual Acuity Review  Right Eye Distance:   Left Eye Distance:   Bilateral Distance:    Right Eye Near:   Left Eye Near:    Bilateral Near:         MDM   1. Bronchitis   2. Cough    Levaquin 500mg  one po qd #10 Medrol dosepak 4mg  #21 Tessalon Perles Albuterol MDI  Push po fluids, rest, tylenol and motrin otc prn as directed for fever, arthralgias, and myalgias.  Follow up prn if  sx's continue or persist.   Deatra CanterOxford, Michaelle Bottomley J, OregonFNP 05/29/17 1701

## 2017-05-31 ENCOUNTER — Encounter (HOSPITAL_COMMUNITY): Payer: Self-pay

## 2017-05-31 ENCOUNTER — Emergency Department (HOSPITAL_COMMUNITY): Payer: Commercial Managed Care - PPO

## 2017-05-31 ENCOUNTER — Telehealth: Payer: Self-pay | Admitting: *Deleted

## 2017-05-31 ENCOUNTER — Emergency Department (HOSPITAL_COMMUNITY)
Admission: EM | Admit: 2017-05-31 | Discharge: 2017-05-31 | Disposition: A | Discharge status: Home / Self Care | Payer: Commercial Managed Care - PPO | Attending: Emergency Medicine | Admitting: Emergency Medicine

## 2017-05-31 DIAGNOSIS — R9389 Abnormal findings on diagnostic imaging of other specified body structures: Secondary | ICD-10-CM | POA: Diagnosis present

## 2017-05-31 DIAGNOSIS — J449 Chronic obstructive pulmonary disease, unspecified: Secondary | ICD-10-CM | POA: Insufficient documentation

## 2017-05-31 DIAGNOSIS — Z7951 Long term (current) use of inhaled steroids: Secondary | ICD-10-CM | POA: Diagnosis not present

## 2017-05-31 DIAGNOSIS — J189 Pneumonia, unspecified organism: Secondary | ICD-10-CM

## 2017-05-31 DIAGNOSIS — R938 Abnormal findings on diagnostic imaging of other specified body structures: Secondary | ICD-10-CM | POA: Diagnosis not present

## 2017-05-31 DIAGNOSIS — F17200 Nicotine dependence, unspecified, uncomplicated: Secondary | ICD-10-CM | POA: Insufficient documentation

## 2017-05-31 DIAGNOSIS — R05 Cough: Secondary | ICD-10-CM | POA: Diagnosis present

## 2017-05-31 DIAGNOSIS — J181 Lobar pneumonia, unspecified organism: Secondary | ICD-10-CM | POA: Diagnosis not present

## 2017-05-31 DIAGNOSIS — R590 Localized enlarged lymph nodes: Secondary | ICD-10-CM | POA: Diagnosis not present

## 2017-05-31 HISTORY — DX: Chronic obstructive pulmonary disease, unspecified: J44.9

## 2017-05-31 LAB — CBC WITH DIFFERENTIAL/PLATELET
BASOS ABS: 0 10*3/uL (ref 0.0–0.1)
BASOS PCT: 0 %
EOS ABS: 0.1 10*3/uL (ref 0.0–0.7)
Eosinophils Relative: 1 %
HCT: 39.1 % (ref 36.0–46.0)
Hemoglobin: 13.6 g/dL (ref 12.0–15.0)
Lymphocytes Relative: 10 %
Lymphs Abs: 1.7 10*3/uL (ref 0.7–4.0)
MCH: 30 pg (ref 26.0–34.0)
MCHC: 34.8 g/dL (ref 30.0–36.0)
MCV: 86.1 fL (ref 78.0–100.0)
MONO ABS: 1.2 10*3/uL — AB (ref 0.1–1.0)
MONOS PCT: 7 %
NEUTROS PCT: 82 %
Neutro Abs: 13.5 10*3/uL — ABNORMAL HIGH (ref 1.7–7.7)
Platelets: 361 10*3/uL (ref 150–400)
RBC: 4.54 MIL/uL (ref 3.87–5.11)
RDW: 13.3 % (ref 11.5–15.5)
WBC: 16.5 10*3/uL — ABNORMAL HIGH (ref 4.0–10.5)

## 2017-05-31 LAB — TROPONIN I

## 2017-05-31 LAB — BASIC METABOLIC PANEL
Anion gap: 8 (ref 5–15)
BUN: 16 mg/dL (ref 6–20)
CALCIUM: 8.9 mg/dL (ref 8.9–10.3)
CO2: 25 mmol/L (ref 22–32)
Chloride: 105 mmol/L (ref 101–111)
Creatinine, Ser: 0.84 mg/dL (ref 0.44–1.00)
GFR calc non Af Amer: >60 mL/min (ref >60)
Glucose, Bld: 140 mg/dL — ABNORMAL HIGH (ref 65–99)
Potassium: 3.2 mmol/L — ABNORMAL LOW (ref 3.5–5.1)
SODIUM: 138 mmol/L (ref 135–145)

## 2017-05-31 LAB — LACTIC ACID, PLASMA
LACTIC ACID, VENOUS: 1.6 mmol/L (ref 0.5–1.9)
LACTIC ACID, VENOUS: 1.7 mmol/L (ref 0.5–1.9)

## 2017-05-31 LAB — D-DIMER, QUANTITATIVE (NOT AT ARMC): D DIMER QUANT: 1.31 ug{FEU}/mL — AB (ref 0.00–0.50)

## 2017-05-31 MED ORDER — LEVOFLOXACIN IN D5W 750 MG/150ML IV SOLN
750.0000 mg | Freq: Once | INTRAVENOUS | Status: AC
  Administered 2017-05-31: 750 mg via INTRAVENOUS
  Filled 2017-05-31: qty 150

## 2017-05-31 MED ORDER — ACETAMINOPHEN 325 MG PO TABS
650.0000 mg | ORAL_TABLET | Freq: Once | ORAL | Status: DC
  Filled 2017-05-31: qty 2

## 2017-05-31 MED ORDER — IPRATROPIUM-ALBUTEROL 0.5-2.5 (3) MG/3ML IN SOLN
3.0000 mL | Freq: Once | RESPIRATORY_TRACT | Status: AC
  Administered 2017-05-31: 3 mL via RESPIRATORY_TRACT
  Filled 2017-05-31: qty 3

## 2017-05-31 MED ORDER — LEVOFLOXACIN 750 MG PO TABS: 750.0000 mg | ORAL_TABLET | Freq: Every day | ORAL | 0 refills | Status: DC

## 2017-05-31 MED ORDER — ALBUTEROL SULFATE (2.5 MG/3ML) 0.083% IN NEBU
2.5000 mg | INHALATION_SOLUTION | Freq: Once | RESPIRATORY_TRACT | Status: AC
  Administered 2017-05-31: 2.5 mg via RESPIRATORY_TRACT
  Filled 2017-05-31: qty 3

## 2017-05-31 MED ORDER — POTASSIUM CHLORIDE CRYS ER 20 MEQ PO TBCR
40.0000 meq | EXTENDED_RELEASE_TABLET | Freq: Once | ORAL | Status: AC
  Administered 2017-05-31: 40 meq via ORAL
  Filled 2017-05-31: qty 2

## 2017-05-31 MED ORDER — IOPAMIDOL (ISOVUE-370) INJECTION 76%
INTRAVENOUS | Status: AC
  Filled 2017-05-31: qty 100

## 2017-05-31 MED ORDER — IOPAMIDOL (ISOVUE-370) INJECTION 76%
100.0000 mL | Freq: Once | INTRAVENOUS | Status: AC | PRN
  Administered 2017-05-31: 100 mL via INTRAVENOUS

## 2017-05-31 NOTE — ED Notes (Signed)
Patient transported to X-ray 

## 2017-05-31 NOTE — ED Triage Notes (Addendum)
Patient c/o productive cough with yellow sputum and fever as high as 101.0 orally at night x 3 days. Patient also c/o left rib cage pain when she coughs. [facility] he same.

## 2017-05-31 NOTE — ED Provider Notes (Signed)
[facility]-EMERGENCY DEPT Provider Note   CSN: [id number] Arrival date & time: 05/31/17  0725     History   Chief Complaint Chief Complaint  Patient presents with  . Cough  . Fever  . Shortness of Breath    HPI Felicia Lawson is a 61 y.o. female.  HPI  Pt was seen at 0750. Per pt, c/o gradual onset and persistence of constant cough, and home fevers to "101" for the past 3 to 4 days. Pt describes the cough as productive of "yellow" sputum. Has been associated with constant left sided ribs "pain" which worsens with coughing, as well as generalized body aches.  [facility] these [facility], dx bronchitis, rx tessalon, levaquin, medrol dosepack and albuterol MDI. Pt states she did not fill the prescriptions. Pt came to the ED today because her symptoms continue. Denies new symptoms. Denies rash, no SOB, no palpitations, no abd pain, no N/V/D, no back pain.    Past Medical History:  Diagnosis Date  . COPD (chronic obstructive pulmonary disease) (HCC)   . Trichomonas infection     There are no active problems to display for this patient.   Past Surgical History:  Procedure Laterality Date  . CESAREAN SECTION    . TUBAL LIGATION      OB History    Gravida Para Term Preterm AB Living   9 8 8  0 1 6   SAB TAB Ectopic Multiple Live Births   0 1 0 0         Home Medications    Prior to Admission medications   Medication Sig Start Date End Date Taking? Authorizing Provider  albuterol (PROVENTIL HFA;VENTOLIN HFA) 108 (90 Base) MCG/ACT inhaler Inhale 2 puffs into the lungs every 4 (four) hours as needed for wheezing or shortness of breath (cough, shortness of breath or wheezing.). 05/29/17  Yes Oxford, Anselm PancoastWilliam J, FNP  levofloxacin (LEVAQUIN) 500 MG tablet Take 1 tablet (500 mg total) by mouth daily. 05/29/17  Yes Deatra Canterxford, William J, FNP  methylPREDNISolone (MEDROL DOSEPAK) 4 MG TBPK tablet Take 6-5-4-3-2-1 po qd 05/29/17  Yes [place], Anselm PancoastWilliam J, FNP  benzonatate  (TESSALON) 100 MG capsule Take 1-2 capsules (100-200 mg total) by mouth 3 (three) times daily as needed for cough. Patient not taking: Reported on 05/31/2017 03/22/17   Elvina SidleLauenstein, Kurt, MD  benzonatate (TESSALON) 100 MG capsule Take 2 capsules (200 mg total) by mouth 3 (three) times daily as needed for cough. Patient not taking: Reported on 05/31/2017 05/29/17   Deatra Canterxford, William J, FNP  predniSONE (DELTASONE) 20 MG tablet Two daily with food Patient not taking: Reported on 05/31/2017 03/22/17   Elvina SidleLauenstein, Kurt, MD    Family History Family History  Problem Relation Age of Onset  . Diabetes Mother   . Diabetes Sister   . Hypertension Sister     Social History Social History  Substance Use Topics  . Smoking status: Current Every Day Smoker    Packs/day: 0.50  . Smokeless tobacco: Not on file  . Alcohol use No     Allergies   Patient has no known allergies.   Review of Systems Review of Systems ROS: Statement: All systems negative except as marked or noted in the HPI; Constitutional: +fever and chills, generalized body aches. ; ; Eyes: Negative for eye pain, redness and discharge. ; ; ENMT: Negative for ear pain, hoarseness, nasal congestion, sinus pressure and sore throat. ; ; Cardiovascular: Negative for palpitations, diaphoresis, dyspnea and  peripheral edema. ; ; Respiratory: +cough. Negative for wheezing and stridor. ; ; Gastrointestinal: Negative for nausea, vomiting, diarrhea, abdominal pain, blood in stool, hematemesis, jaundice and rectal bleeding. . ; ; Genitourinary: Negative for dysuria, flank pain and hematuria. ; ; Musculoskeletal: +left ribs pain. Negative for back pain and neck pain. Negative for swelling and trauma.; ; Skin: Negative for pruritus, rash, abrasions, blisters, bruising and skin lesion.; ; Neuro: Negative for headache, lightheadedness and neck stiffness. Negative for weakness, altered level of consciousness, altered mental status, extremity weakness, paresthesias,  involuntary movement, seizure and syncope.      Physical Exam Updated Vital Signs BP 125/69 (BP Location: Left Arm)   Pulse (!) 117   Temp 98.6 F (37 C) (Oral)   Resp 18   Ht 5\' 3"  (1.6 m)   Wt 54.4 kg (120 lb)   SpO2 97%   BMI 21.26 kg/m     Physical Exam 0755: Physical examination:  Nursing notes reviewed; Vital signs and O2 SAT reviewed;  Constitutional: Well developed, Well nourished, Well hydrated, In no acute distress; Head:  Normocephalic, atraumatic; Eyes: EOMI, PERRL, No scleral icterus; ENMT: Mouth and pharynx normal, Mucous membranes moist; Neck: Supple, Full range of motion, No lymphadenopathy; Cardiovascular: Tachycardic rate and rhythm, No gallop; Respiratory: Breath sounds coarse & equal bilaterally, No wheezes.  Speaking full sentences with ease, Normal respiratory effort/excursion; Chest: Nontender, Movement normal; Abdomen: Soft, Nontender, Nondistended, Normal bowel sounds; Genitourinary: No CVA tenderness; Extremities: Pulses normal, No tenderness, No edema, No calf edema or asymmetry.; Neuro: AA&Ox3, Major CN grossly intact.  Speech clear. No gross focal motor or sensory deficits in extremities.; Skin: Color normal, Warm, Dry.   ED Treatments / Results  Labs (all labs ordered are listed, but only abnormal results are displayed)   EKG  EKG Interpretation  Date/Time:  Thursday May 31 2017 08:24:45 EDT Ventricular Rate:  95 PR Interval:    QRS Duration: 86 QT Interval:  347 QTC Calculation: 437 R Axis:   26 Text Interpretation:  Sinus rhythm ST elevation, consider inferior injury No significant change since last tracing Confirmed by Melene Plan [phone]) on 05/31/2017 9:19:48 AM       Radiology   Procedures Procedures (including critical care time)  Medications Ordered in ED Medications  ipratropium-albuterol (DUONEB) 0.5-2.5 (3) MG/3ML nebulizer solution 3 mL (not administered)  albuterol (PROVENTIL) (2.5 MG/3ML) 0.083% nebulizer solution 2.5 mg  (not administered)  acetaminophen (TYLENOL) tablet 650 mg (650 mg Oral Refused 05/31/17 0805)     Initial Impression / Assessment and Plan / ED Course  I have reviewed the triage vital signs and the nursing notes.  Pertinent labs & imaging results that were available during my care of the patient were reviewed by me and considered in my medical decision making (see chart for details).  MDM Reviewed: previous chart, nursing note and vitals Reviewed previous: labs, ECG and x-ray Interpretation: labs, ECG and x-ray    Results for orders placed or performed during the hospital encounter of 05/31/17  Basic metabolic panel  Result Value Ref Range   Sodium 138 135 - 145 mmol/L   Potassium 3.2 (L) 3.5 - 5.1 mmol/L   Chloride 105 101 - 111 mmol/L   CO2 25 22 - 32 mmol/L   Glucose, Bld 140 (H) 65 - 99 mg/dL   BUN 16 6 - 20 mg/dL   Creatinine, Ser 6.04 0.44 - 1.00 mg/dL   Calcium 8.9 8.9 - 54.0 mg/dL   GFR calc non Af  Amer >60 >60 mL/min   GFR calc Af Amer >60 >60 mL/min   Anion gap 8 5 - 15  Troponin I  Result Value Ref Range   Troponin I <0.03 <0.03 ng/mL  CBC with Differential  Result Value Ref Range   WBC 16.5 (H) 4.0 - 10.5 K/uL   RBC 4.54 3.87 - 5.11 MIL/uL   Hemoglobin 13.6 12.0 - 15.0 g/dL   HCT 16.1 09.6 - 04.5 %   MCV 86.1 78.0 - 100.0 fL   MCH 30.0 26.0 - 34.0 pg   MCHC 34.8 30.0 - 36.0 g/dL   RDW 40.9 81.1 - 91.4 %   Platelets 361 150 - 400 K/uL   Neutrophils Relative % 82 %   Neutro Abs 13.5 (H) 1.7 - 7.7 K/uL   Lymphocytes Relative 10 %   Lymphs Abs 1.7 0.7 - 4.0 K/uL   Monocytes Relative 7 %   Monocytes Absolute 1.2 (H) 0.1 - 1.0 K/uL   Eosinophils Relative 1 %   Eosinophils Absolute 0.1 0.0 - 0.7 K/uL   Basophils Relative 0 %   Basophils Absolute 0.0 0.0 - 0.1 K/uL  D-dimer, quantitative  Result Value Ref Range   D-Dimer, Quant 1.31 (H) 0.00 - 0.50 ug/mL-FEU  Lactic acid, plasma  Result Value Ref Range   Lactic Acid, Venous 1.6 0.5 - 1.9 mmol/L   Dg  Chest 2 View Result Date: 05/31/2017 CLINICAL DATA:  Shortness of breath with cough and congestion. Fever. EXAM: CHEST  2 VIEW COMPARISON:  March 22, 2017 ; Apr 26, 2014 FINDINGS: There is bilateral apical pleural thickening with bilateral apical scarring. There is also scarring left base. There is no appreciable edema or consolidation. Heart size and pulmonary vascularity are normal. No adenopathy. No bone lesions. IMPRESSION: Apparent scarring in the left base as well is in the upper lung zones and apices. No new opacity evident. Heart size and pulmonary vascularity are normal. No adenopathy. Electronically Signed   By: Bretta Bang III M.D.   On: 05/31/2017 08:15    Ct Angio Chest Pe W/cm &/or Wo Cm Result Date: 05/31/2017 CLINICAL DATA:  Chest pain and fever EXAM: CT ANGIOGRAPHY CHEST WITH CONTRAST TECHNIQUE: Multidetector CT imaging of the chest was performed using the standard protocol during bolus administration of intravenous contrast. Multiplanar CT image reconstructions and MIPs were obtained to evaluate the vascular anatomy. CONTRAST:  100 mL Isovue 370 nonionic COMPARISON:  Chest radiograph May 31, 2017 FINDINGS: Cardiovascular: There is no appreciable pulmonary embolus. There is no thoracic aortic aneurysm or dissection. The visualized great vessels show moderate atherosclerotic calcification, most notably in the proximal left subclavian artery. There are foci of atherosclerotic calcification in the aorta. Pericardium is not appreciably thickened. There is mild coronary artery calcification. Mediastinum/Nodes: Thyroid appears unremarkable. There is adenopathy in the sub- carinal region measuring 4.3 x 3.2 x 2.5 cm. There is a lymph node just to the left of the upper carina measuring 1.5 x 1.5 x 1.4 cm. There is a lymph node in the left hilum measuring 1.5 x 1.0 x 1.0 cm. Lungs/Pleura: There is underlying centrilobular emphysematous change. There is scarring in each lung apex. A focal nodular  opacity in the apical segment of the right upper lobe measuring 1.1 x 1.0 cm, best appreciated on axial slice 15 series 8. There is airspace consolidation throughout much of the left lower lobe with multiple areas of left lower lobe bronchial wall are walking/obstruction with airspace consolidation more distally to varying degrees  in all segments of note that there is a degree of lower lobe bronchiectatic change. There is mild scarring in the lateral right base. On axial slice 82 series 8, there is a 6 x 5 mm nodular opacity in the posterior segment of the right lower lobe. There is no appreciable pleural effusion or pleural thickening. The left lower lobe. Upper Abdomen: There is incomplete visualization of a cyst arising from the periphery of the left kidney measuring 7.7 x 7.0 cm. This cyst appears lobulated and contains a few calcifications. This cyst cannot be classified as a simple cyst. There is a probable accessory spleen just posterior to the spleen abutting the left hemidiaphragm. Visualized upper abdominal structures otherwise appear unremarkable. Musculoskeletal: There are no blastic or lytic bone lesions. Review of the MIP images confirms the above findings. IMPRESSION: No evident pulmonary embolus. There is adenopathy, most pronounced in the sub- carinal region. This degree a adenopathy in the subcarinal region is concerning for underlying neoplastic lesion. Underlying centrilobular emphysema. Extensive peripheral left lower lobe bronchiolar mucous plugging an obstruction with peripheral pneumonia throughout much of the left lower lobe. Extensive scarring in the lung apices. There is an irregular nodular opacity in the apical segment right upper lobe measuring 1.1 x 1.0 cm. There is a 6 x 5 mm nodular opacity in the posterior right base. It should be noted that additional nodular opacities easily be obscured in the apices given the degree of scarring and mucosal thickening present in these areas.  Apical region pneumonia cannot be excluded given this circumstance as well. Likewise, a pulmonary neoplastic lesion could be obscured in the left lower lobe by the degree of pneumonia. Large mildly complex cyst arising from upper pole left kidney, incompletely visualized. It should be noted that most recent radiographic examination significantly underestimates the amount of abnormality noted on current CT. Given this circumstance, a repeat CT in 4-6 weeks to assess for degree of clearing of pneumonia in the left lower lobe after appropriate treatment may be warranted. With respect to concern for neoplasm given the degree of sub- carinal adenopathy, nuclear medicine PET-CT examination may wish to be considered to further assess as well. Aortic Atherosclerosis (ICD10-I70.0) and Emphysema (ICD10-J43.9). Electronically Signed   By: Bretta Bang III M.D.   On: 05/31/2017 10:39     0755:  Pt states she did not fill the prescriptions and came to the ED today "because I think I have pneumonia and want to be treated for it." I explained to pt that the medications that were prescribed 2 days ago were the appropriate treatment for pneumonia. Pt verb understanding.   0805:  Pt refuses tylenol for her body aches.   1110:  Potassium repleted PO. IV levaquin ordered.  Pt has not filled her rx from 2 days ago, concern regarding compliance, as well as CT findings today (possible neoplastic disease) and pt without PMD. Dx and testing d/w pt.  Questions answered.  Verb understanding, agreeable to admit.  T/C to [facility] Arthor Captain, case discussed, including:  HPI, pertinent PM/SHx, VS/PE, dx testing, ED course and treatment:  Agreeable to come to ED for evaluation.   1145:  [facility] Dr. Arthor Captain has evaluated pt in the ED:  States pt does not need to be admitted, needs to fill her prescriptions, increase levaquin to 750mg  qdaily x5 days, find a PMD and have repeat CT scan 4-6 weeks. Will have SW/CM consult regarding obtaining  PMD for f/u.      Final Clinical  Impressions(s) / ED Diagnoses   Final diagnoses:  None    New Prescriptions New Prescriptions   No medications on file     Samuel Jester, DO 06/03/17 1637

## 2017-05-31 NOTE — Consult Note (Signed)
Consult note    Siobahn Worsley ZOX:[id number] DOB: [birth year] DOA: 05/31/2017  PCP: Patient, No Pcp Per   Chief Complaint: Shortness of breath and fever  This is noted for consultation requested by Dr. Clarene Duke  HPI: Felicia Lawson is a 61 y.o. female with medical history significant of COPD came into the hospital complaining about shortness of breath. She was recently in the hospital complaining about the same she was discharged on Levaquin and benzonatate, she did not fill up her prescription. She is not tachypneic, afebrile, oxygen saturation okay in room air, discussed with her patient reported she will take her medications this time.  ED Course:  Vitals: WNL Labs: WNL except for potassium of 3.2 Imaging: CT angiography showed left lower lobe pneumonia and extensive scarring in the leg presses, she had adenopathy and for sure needs follow-up with CT scan in 4-6 weeks. Interventions: Given potassium and levofloxacin  Review of Systems:  Constitutional: negative for anorexia, fevers and sweats Eyes: negative for irritation, redness and visual disturbance Ears, nose, mouth, throat, and face: negative for earaches, epistaxis, nasal congestion and sore throat Respiratory: negative for cough, dyspnea on exertion, sputum and wheezing Cardiovascular: negative for chest pain, dyspnea, lower extremity edema, orthopnea, palpitations and syncope Gastrointestinal: negative for abdominal pain, constipation, diarrhea, melena, nausea and vomiting Genitourinary:negative for dysuria, frequency and hematuria Hematologic/lymphatic: negative for bleeding, easy bruising and lymphadenopathy Musculoskeletal:negative for arthralgias, muscle weakness and stiff joints Neurological: negative for coordination problems, gait problems, headaches and weakness Endocrine: negative for diabetic symptoms including polydipsia, polyuria and weight loss Allergic/Immunologic: negative for anaphylaxis, hay fever and  urticaria  Past Medical History:  Diagnosis Date  . COPD (chronic obstructive pulmonary disease) (HCC)   . Trichomonas infection     Past Surgical History:  Procedure Laterality Date  . CESAREAN SECTION    . TUBAL LIGATION       reports that she has been smoking.  She has been smoking about 0.50 packs per day. She does not have any smokeless tobacco history on file. She reports that she does not drink alcohol or use drugs.  No Known Allergies  Family History  Problem Relation Age of Onset  . Diabetes Mother   . Diabetes Sister   . Hypertension Sister     Prior to Admission medications   Medication Sig Start Date End Date Taking? Authorizing Provider  albuterol (PROVENTIL HFA;VENTOLIN HFA) 108 (90 Base) MCG/ACT inhaler Inhale 2 puffs into the lungs every 4 (four) hours as needed for wheezing or shortness of breath (cough, shortness of breath or wheezing.). 05/29/17  Yes Oxford, Anselm Pancoast, FNP  levofloxacin (LEVAQUIN) 500 MG tablet Take 1 tablet (500 mg total) by mouth daily. 05/29/17  Yes Deatra Canter, FNP  methylPREDNISolone (MEDROL DOSEPAK) 4 MG TBPK tablet Take 6-5-4-3-2-1 po qd 05/29/17  Yes Oxford, Anselm Pancoast, FNP  benzonatate (TESSALON) 100 MG capsule Take 1-2 capsules (100-200 mg total) by mouth 3 (three) times daily as needed for cough. Patient not taking: Reported on 05/31/2017 03/22/17   Elvina Sidle, MD  benzonatate (TESSALON) 100 MG capsule Take 2 capsules (200 mg total) by mouth 3 (three) times daily as needed for cough. Patient not taking: Reported on 05/31/2017 05/29/17   Deatra Canter, FNP  predniSONE (DELTASONE) 20 MG tablet Two daily with food Patient not taking: Reported on 05/31/2017 03/22/17   Elvina Sidle, MD    Physical Exam:  Vitals:   05/31/17 8119 05/31/17 0740 05/31/17 1022 05/31/17 1115  BP: 125/69  113/60 112/63  Pulse: (!) 117  89 86  Resp: 18  20 (!) 30  Temp: 98.6 F (37 C)     TempSrc: Oral     SpO2: 97%  99% 94%  Weight:  54.4 kg  (120 lb)    Height:  5\' 3"  (1.6 m)      Constitutional: NAD, calm, comfortable Eyes: PERRL, lids and conjunctivae normal ENMT: Mucous membranes are moist. Posterior pharynx clear of any exudate or lesions.Normal dentition.  Neck: normal, supple, no masses, no thyromegaly Respiratory: clear to auscultation bilaterally, no wheezing, no crackles. Normal respiratory effort. No accessory muscle use.  Cardiovascular: Regular rate and rhythm, no murmurs / rubs / gallops. No extremity edema. 2+ pedal pulses. No carotid bruits.  Abdomen: no tenderness, no masses palpated. No hepatosplenomegaly. Bowel sounds positive.  Musculoskeletal: no clubbing / cyanosis. No joint deformity upper and lower extremities. Good ROM, no contractures. Normal muscle tone.  Skin: no rashes, lesions, ulcers. No induration Neurologic: CN 2-12 grossly intact. Sensation intact, DTR normal. Strength 5/5 in all 4.  Psychiatric: Normal judgment and insight. Alert and oriented x 3. Normal mood.   Labs on Admission: I have personally reviewed following labs and imaging studies  CBC:  Recent Labs Lab 05/31/17 0804  WBC 16.5*  NEUTROABS 13.5*  HGB 13.6  HCT 39.1  MCV 86.1  PLT 361   Basic Metabolic Panel:  Recent Labs Lab 05/31/17 0804  NA 138  K 3.2*  CL 105  CO2 25  GLUCOSE 140*  BUN 16  CREATININE 0.84  CALCIUM 8.9   GFR: Estimated Creatinine Clearance: 56.7 mL/min (by C-G formula based on SCr of 0.84 mg/dL). Liver Function Tests: No results for input(s): AST, ALT, ALKPHOS, BILITOT, PROT, ALBUMIN in the last 168 hours. No results for input(s): LIPASE, AMYLASE in the last 168 hours. No results for input(s): AMMONIA in the last 168 hours. Coagulation Profile: No results for input(s): INR, PROTIME in the last 168 hours. Cardiac Enzymes:  Recent Labs Lab 05/31/17 0804  TROPONINI <0.03   BNP (last 3 results) No results for input(s): PROBNP in the last 8760 hours. HbA1C: No results for input(s):  HGBA1C in the last 72 hours. CBG: No results for input(s): GLUCAP in the last 168 hours. Lipid Profile: No results for input(s): CHOL, HDL, LDLCALC, TRIG, CHOLHDL, LDLDIRECT in the last 72 hours. Thyroid Function Tests: No results for input(s): TSH, T4TOTAL, FREET4, T3FREE, THYROIDAB in the last 72 hours. Anemia Panel: No results for input(s): VITAMINB12, FOLATE, FERRITIN, TIBC, IRON, RETICCTPCT in the last 72 hours. Urine analysis:    Component Value Date/Time   COLORURINE YELLOW 01/18/2015 1007   APPEARANCEUR HAZY (A) 01/18/2015 1007   LABSPEC 1.020 01/18/2015 1007   PHURINE 7.0 01/18/2015 1007   GLUCOSEU NEGATIVE 01/18/2015 1007   HGBUR SMALL (A) 01/18/2015 1007   BILIRUBINUR NEGATIVE 01/18/2015 1007   KETONESUR NEGATIVE 01/18/2015 1007   PROTEINUR NEGATIVE 01/18/2015 1007   UROBILINOGEN 0.2 01/18/2015 1007   NITRITE NEGATIVE 01/18/2015 1007   LEUKOCYTESUR LARGE (A) 01/18/2015 1007   Sepsis Labs: !!!!!!!!!!!!!!!!!!!!!!!!!!!!!!!!!!!!!!!!!!!! Invalid input(s): PROCALCITONIN, LACTICIDVEN No results found for this or any previous visit (from the past 240 hour(s)).   Radiological Exams on Admission: Dg Chest 2 View  Result Date: 05/31/2017 CLINICAL DATA:  Shortness of breath with cough and congestion. Fever. EXAM: CHEST  2 VIEW COMPARISON:  March 22, 2017 ; Apr 26, 2014 FINDINGS: There is bilateral apical pleural thickening with bilateral apical scarring. There is also scarring  left base. There is no appreciable edema or consolidation. Heart size and pulmonary vascularity are normal. No adenopathy. No bone lesions. IMPRESSION: Apparent scarring in the left base as well is in the upper lung zones and apices. No new opacity evident. Heart size and pulmonary vascularity are normal. No adenopathy. Electronically Signed   By: Bretta Bang III M.D.   On: 05/31/2017 08:15   Ct Angio Chest Pe W/cm &/or Wo Cm  Result Date: 05/31/2017 CLINICAL DATA:  Chest pain and fever EXAM: CT  ANGIOGRAPHY CHEST WITH CONTRAST TECHNIQUE: Multidetector CT imaging of the chest was performed using the standard protocol during bolus administration of intravenous contrast. Multiplanar CT image reconstructions and MIPs were obtained to evaluate the vascular anatomy. CONTRAST:  100 mL Isovue 370 nonionic COMPARISON:  Chest radiograph May 31, 2017 FINDINGS: Cardiovascular: There is no appreciable pulmonary embolus. There is no thoracic aortic aneurysm or dissection. The visualized great vessels show moderate atherosclerotic calcification, most notably in the proximal left subclavian artery. There are foci of atherosclerotic calcification in the aorta. Pericardium is not appreciably thickened. There is mild coronary artery calcification. Mediastinum/Nodes: Thyroid appears unremarkable. There is adenopathy in the sub- carinal region measuring 4.3 x 3.2 x 2.5 cm. There is a lymph node just to the left of the upper carina measuring 1.5 x 1.5 x 1.4 cm. There is a lymph node in the left hilum measuring 1.5 x 1.0 x 1.0 cm. Lungs/Pleura: There is underlying centrilobular emphysematous change. There is scarring in each lung apex. A focal nodular opacity in the apical segment of the right upper lobe measuring 1.1 x 1.0 cm, best appreciated on axial slice 15 series 8. There is airspace consolidation throughout much of the left lower lobe with multiple areas of left lower lobe bronchial wall are walking/obstruction with airspace consolidation more distally to varying degrees in all segments of note that there is a degree of lower lobe bronchiectatic change. There is mild scarring in the lateral right base. On axial slice 82 series 8, there is a 6 x 5 mm nodular opacity in the posterior segment of the right lower lobe. There is no appreciable pleural effusion or pleural thickening. The left lower lobe. Upper Abdomen: There is incomplete visualization of a cyst arising from the periphery of the left kidney measuring 7.7 x 7.0  cm. This cyst appears lobulated and contains a few calcifications. This cyst cannot be classified as a simple cyst. There is a probable accessory spleen just posterior to the spleen abutting the left hemidiaphragm. Visualized upper abdominal structures otherwise appear unremarkable. Musculoskeletal: There are no blastic or lytic bone lesions. Review of the MIP images confirms the above findings. IMPRESSION: No evident pulmonary embolus. There is adenopathy, most pronounced in the sub- carinal region. This degree a adenopathy in the subcarinal region is concerning for underlying neoplastic lesion. Underlying centrilobular emphysema. Extensive peripheral left lower lobe bronchiolar mucous plugging an obstruction with peripheral pneumonia throughout much of the left lower lobe. Extensive scarring in the lung apices. There is an irregular nodular opacity in the apical segment right upper lobe measuring 1.1 x 1.0 cm. There is a 6 x 5 mm nodular opacity in the posterior right base. It should be noted that additional nodular opacities easily be obscured in the apices given the degree of scarring and mucosal thickening present in these areas. Apical region pneumonia cannot be excluded given this circumstance as well. Likewise, a pulmonary neoplastic lesion could be obscured in the left lower lobe by  the degree of pneumonia. Large mildly complex cyst arising from upper pole left kidney, incompletely visualized. It should be noted that most recent radiographic examination significantly underestimates the amount of abnormality noted on current CT. Given this circumstance, a repeat CT in 4-6 weeks to assess for degree of clearing of pneumonia in the left lower lobe after appropriate treatment may be warranted. With respect to concern for neoplasm given the degree of sub- carinal adenopathy, nuclear medicine PET-CT examination may wish to be considered to further assess as well. Aortic Atherosclerosis (ICD10-I70.0) and Emphysema  (ICD10-J43.9). Electronically Signed   By: Bretta Bang III M.D.   On: 05/31/2017 10:39    EKG: Independently reviewed.   Assessment/Plan Principal Problem:   CAP (community acquired pneumonia) Active Problems:   Abnormal x-ray    Committed acquired pneumonia -Left lower lobe pneumonia with mucous plugging. -Recommend levofloxacin 750 mg for 5-7 days, recommend mucolytic like Mucinex. -Follow-up with PCP in 1-2 weeks.  Abnormal x-ray -Left lower lobe pneumonia with mucous plugging, biapical scarring and adenopathy. -Recommended CT scan in 4-6 weeks to ensure resolution and to rule out malignancy.  Hypokalemia -Per EDP   Recommended discharge home on levofloxacin 750 mg, repeat CT scan of chest in 4-6 weeks.   [facility] A MD [facility] Pager [phone]  If 7PM-7AM, please contact night-coverage [email or web address] Password [facility]  05/31/2017, 11:54 AM

## 2017-05-31 NOTE — ED Notes (Signed)
Called patient twice. No answer.

## 2017-05-31 NOTE — ED Notes (Signed)
Per CSW they will contact patient at home at [phone] to offer social work assistance and PCP information. Pt verbalized understanding and understanding of discharge information.

## 2017-05-31 NOTE — Telephone Encounter (Signed)
Shanterica Biehler J. Lucretia RoersWood, RN, BSN, [place] [phone]  [facility]indy Messingoger Gomez[facility]-C [facility] July 11 @ 3:30; pt also on wait list and will be called if cancellation occurs before her scheduled appointment.  Spoke with pt at bedside and advised to please arrive 15 min early and take a picture ID and your current medications.  Pt verbalizes understanding of keeping appointment.

## 2017-05-31 NOTE — Discharge Instructions (Signed)
Take the antibiotic prescription (levaquin) from today's visit, and take it as directed. Fill the other prescriptions you received 2 days ago (except the antibiotic levaquin) and take them as directed.  Your CT scan showed an incidental finding(s):  "1) Large mildly complex cyst arising from upper pole left kidney, incompletely visualized., 2) It should be noted that most recent radiographic examination significantly underestimates the amount of abnormality noted on current CT. Given this circumstance, a repeat CT in 4-6 weeks to assess for degree of clearing of pneumonia in the left lower lobe after appropriate treatment may be warranted., 3) With respect to concern for neoplasm given the degree of sub- carinal adenopathy, nuclear medicine PET-CT examination may wish to be considered to further assess as well."  Your regular medical doctor can follow up these findings. Call your regular medical doctor today to schedule a follow up appointment within the next 3 days.  Return to the Emergency Department immediately sooner if worsening.

## 2017-05-31 NOTE — ED Notes (Signed)
Patient states she has generalized body aches but refuses Tylenol. Will let me know if she decides she wants it.

## 2017-05-31 NOTE — ED Notes (Signed)
Patient transported to CT 

## 2017-06-20 ENCOUNTER — Inpatient Hospital Stay (INDEPENDENT_AMBULATORY_CARE_PROVIDER_SITE_OTHER): Payer: Commercial Managed Care - PPO | Admitting: Physician Assistant

## 2017-10-11 ENCOUNTER — Ambulatory Visit (HOSPITAL_COMMUNITY)
Admission: EM | Admit: 2017-10-11 | Discharge: 2017-10-11 | Disposition: A | Discharge status: Home / Self Care | Payer: Commercial Managed Care - PPO | Attending: Emergency Medicine | Admitting: Emergency Medicine

## 2017-10-11 ENCOUNTER — Ambulatory Visit (INDEPENDENT_AMBULATORY_CARE_PROVIDER_SITE_OTHER): Payer: Commercial Managed Care - PPO

## 2017-10-11 ENCOUNTER — Encounter (HOSPITAL_COMMUNITY): Payer: Self-pay | Admitting: Emergency Medicine

## 2017-10-11 DIAGNOSIS — R05 Cough: Secondary | ICD-10-CM | POA: Diagnosis not present

## 2017-10-11 DIAGNOSIS — J069 Acute upper respiratory infection, unspecified: Secondary | ICD-10-CM

## 2017-10-11 DIAGNOSIS — R531 Weakness: Secondary | ICD-10-CM

## 2017-10-11 DIAGNOSIS — R0789 Other chest pain: Secondary | ICD-10-CM | POA: Diagnosis not present

## 2017-10-11 MED ORDER — PREDNISONE 20 MG PO TABS
60.0000 mg | ORAL_TABLET | Freq: Once | ORAL | Status: AC
  Administered 2017-10-11: 60 mg via ORAL

## 2017-10-11 MED ORDER — PREDNISONE 20 MG PO TABS: 40.0000 mg | ORAL_TABLET | Freq: Every day | ORAL | 0 refills | Status: AC

## 2017-10-11 MED ORDER — SODIUM CHLORIDE 0.9 % IN NEBU
INHALATION_SOLUTION | RESPIRATORY_TRACT | Status: AC
  Filled 2017-10-11: qty 3

## 2017-10-11 MED ORDER — FLUTICASONE PROPIONATE 50 MCG/ACT NA SUSP: 2.0000 | Freq: Every day | NASAL | 0 refills | Status: DC

## 2017-10-11 MED ORDER — IPRATROPIUM-ALBUTEROL 0.5-2.5 (3) MG/3ML IN SOLN
RESPIRATORY_TRACT | Status: AC
  Filled 2017-10-11: qty 3

## 2017-10-11 MED ORDER — ALBUTEROL SULFATE HFA 108 (90 BASE) MCG/ACT IN AERS: 2.0000 | INHALATION_SPRAY | RESPIRATORY_TRACT | 0 refills | Status: DC | PRN

## 2017-10-11 MED ORDER — PREDNISONE 20 MG PO TABS
ORAL_TABLET | ORAL | Status: AC
  Filled 2017-10-11: qty 3

## 2017-10-11 MED ORDER — IPRATROPIUM-ALBUTEROL 0.5-2.5 (3) MG/3ML IN SOLN
3.0000 mL | Freq: Once | RESPIRATORY_TRACT | Status: AC
  Administered 2017-10-11: 3 mL via RESPIRATORY_TRACT

## 2017-10-11 MED ORDER — AEROCHAMBER PLUS MISC: 2 refills | Status: DC

## 2017-10-11 NOTE — ED Triage Notes (Signed)
Pt c/o diarrhea, chest pain, cough, feels like something is stuck in her throat, pt feels weak. Pt appears well.

## 2017-10-11 NOTE — Discharge Instructions (Signed)
1-2 puffs from your albuterol inhaler every 4-6 hours as needed.  This is spacer.  Also start some Flonase, saline nasal irrigation with Lloyd HugerNeil med rinse or Neti pot.  Finish the steroids.  Follow-up with 1 of the primary care physicians listed below.  Below is a list of primary care practices who are taking new patients for you to follow-up with. [facility] [address] [place], [place] [address] [phone]  [facility] [phone] [address] [place] [address]  [facility]: [address] [place] [id number]  [phone]  [facility]: [address] [place] [id number]   [phone]  [facility]: [address] [place] [address]  [phone]  [facility] : [address] [place] [address] [phone]  [facility]: [address] [place] [phone]  [facility]: [address] [place] [id number] [phone]  Dr. Oneal GroutMahima Pandey [address] [facility] [address] [place] [id number]  [phone]  Dr. Jackie PlumGeorge Osei-Bonsu, [facility]. [address] [place], [place] [address]  [phone]  Go to www.goodrx.com to look up your medications. This will give you a list of where you can find your prescriptions at the most affordable prices. Or ask the pharmacist what the cash price is, or if they have any other discount programs available to help make your medication more affordable. This can be less expensive than what you would pay with insurance.

## 2017-10-11 NOTE — ED Provider Notes (Signed)
HPI  SUBJECTIVE:  Felicia Lawson is a 61 y.o. female who presents with fatigue, sore throat, nasal congestion, rhinorrhea, postnasal drip, wheezing, cough productive of whitish mucus, shortness of breath with exertion for a week.  She denies change in the color or amount of her sputum.  She states that she does not need to stop and rest due to shortness of breath.  She reports intermittent, substernal chest pain described as tightness.  It lasts minutes and then resolves.  This is worse with coughing, lying down, no alleviating factors.  There is no exertional component to it.  She denies the pain as being like pressure or heaviness.  No nausea, diaphoresis, radiation through to her back, up her neck, down her arm.  Reports similar chest pain when she had pneumonia.  No sinus pain or pressure.  No antibiotics in the past month.  No antipyretic in the past 6-8 hours.  There are no alleviating factors.  She has not tried anything for this.  Symptoms are worse with coughing and lying down.  She has a past medical history of recurrent pneumonia, provisional diagnosis of COPD.  She is a former smoker with 20-year pack history of smoking.  No history of diabetes, hypertension, MI, hypercholesterolemia, coronary artery disease.  PMD: None   Past Medical History:  Diagnosis Date  . COPD (chronic obstructive pulmonary disease) (HCC)   . Trichomonas infection     Past Surgical History:  Procedure Laterality Date  . CESAREAN SECTION    . TUBAL LIGATION      Family History  Problem Relation Age of Onset  . Diabetes Mother   . Diabetes Sister   . Hypertension Sister     Social History  Substance Use Topics  . Smoking status: Current Every Day Smoker    Packs/day: 0.50  . Smokeless tobacco: Not on file  . Alcohol use No    No current facility-administered medications for this encounter.   Current Outpatient Prescriptions:  .  albuterol (PROVENTIL HFA;VENTOLIN HFA) 108 (90 Base) MCG/ACT  inhaler, Inhale 2 puffs into the lungs every 4 (four) hours as needed for wheezing or shortness of breath (cough, shortness of breath or wheezing.)., Disp: 1 Inhaler, Rfl: 0 .  fluticasone (FLONASE) 50 MCG/ACT nasal spray, Place 2 sprays into both nostrils daily., Disp: 16 g, Rfl: 0 .  methylPREDNISolone (MEDROL DOSEPAK) 4 MG TBPK tablet, Take 6-5-4-3-2-1 po qd, Disp: 21 tablet, Rfl: 0 .  predniSONE (DELTASONE) 20 MG tablet, Take 2 tablets (40 mg total) by mouth daily with breakfast., Disp: 10 tablet, Rfl: 0 .  Spacer/Aero-Holding Chambers (AEROCHAMBER PLUS) inhaler, Use as instructed, Disp: 1 each, Rfl: 2  No Known Allergies   ROS  As noted in HPI.   Physical Exam  BP (!) 129/58   Pulse 82   Temp 98.4 F (36.9 C) (Oral)   Resp 18   SpO2 98%   Constitutional: Well developed, well nourished, no acute distress Eyes:  EOMI, conjunctiva normal bilaterally HENT: Normocephalic, atraumatic,mucus membranes moist positive rhinorrhea.  No sinus tenderness.  Positive postnasal drip.  Oropharynx normal. Respiratory: Normal inspiratory effort.  Scattered rhonchi that clear with coughing, no wheezing.  Fair air movement.  No chest wall tenderness Cardiovascular: Normal rate no murmurs rubs or gallops GI: nondistended skin: No rash, skin intact Musculoskeletal: no deformities Neurologic: Alert & oriented x 3, no focal neuro deficits Psychiatric: Speech and behavior appropriate   ED Course   Medications  ipratropium-albuterol (DUONEB) 0.5-2.5 (3) MG/3ML nebulizer  solution 3 mL (3 mLs Nebulization Given 10/11/17 1643)  predniSONE (DELTASONE) tablet 60 mg (60 mg Oral Given 10/11/17 1642)    Orders Placed This Encounter  Procedures  . DG Chest 2 View    Standing Status:   Standing    Number of Occurrences:   1    Order Specific Question:   Reason for Exam (SYMPTOM  OR DIAGNOSIS REQUIRED)    Answer:   r/o PNA, COPD  . ED EKG    Standing Status:   Standing    Number of Occurrences:   1     Order Specific Question:   Reason for Exam    Answer:   Chest Pain    No results found for this or any previous visit (from the past 24 hour(s)). Dg Chest 2 View  Result Date: 10/11/2017 CLINICAL DATA:  Tightness in the midsternum area. Productive cough. Feels like something is stuck in her throat. Recent pneumonia. EXAM: CHEST  2 VIEW COMPARISON:  Two-view chest x-ray a 05/31/2017. CT of the chest 05/31/2017 FINDINGS: Heart size is normal. The lungs are hyperinflated. Scarring at the lung apices is again noted bilaterally. Previously noted left lower lobe airspace disease has cleared. There is no new airspace disease. Retraction of the hila is stable. There are no effusions or edema. IMPRESSION: 1. Interval clearing of left lower lobe pneumonia. 2. No acute cardiopulmonary disease. 3. Stable hyperinflation and biapical scarring. Electronically Signed   By: Marin Roberts M.D.   On: 10/11/2017 16:43    ED Clinical Impression  Upper respiratory tract infection, unspecified type   ED Assessment/Plan   EKG: Normal sinus rhythm, rate 76.  Left axis deviation, normal intervals.  No hypertrophy.  No ST-T wave changes compared to EKG from 05/31/2017  Presentation consistent with URI with a possible COPD exacerbation.  Giving DuoNeb, 60 mg prednisone, presumed COPD exacerbation, check chest x-ray, will reevaluate.  Reviewed imaging independently.  Hyperinflation.  No pneumonia.  Scarring at the lung apices, left lower pneumonia resolved.  See radiology report for full details.  Chest pain does not sound cardiac in origin.  She has a normal EKG.    On reevaluation she states that she feels much better.  Her lungs are clear, she has good air movement.  Presentation consistent with a URI with possible COPD exacerbation.  She does not have a pneumonia on chest x-ray.  Will send home with albuterol inhaler with a spacer, 5 more days of steroids, withholding antibiotics at this time.  Provided  primary care referral list for follow-up, to the ER if she gets worse.  Discussed labs, imaging, MDM, plan and followup with patient. Discussed sn/sx that should prompt return to the ED. patient agrees with plan.   Meds ordered this encounter  Medications  . ipratropium-albuterol (DUONEB) 0.5-2.5 (3) MG/3ML nebulizer solution 3 mL  . predniSONE (DELTASONE) tablet 60 mg  . albuterol (PROVENTIL HFA;VENTOLIN HFA) 108 (90 Base) MCG/ACT inhaler    Sig: Inhale 2 puffs into the lungs every 4 (four) hours as needed for wheezing or shortness of breath (cough, shortness of breath or wheezing.).    Dispense:  1 Inhaler    Refill:  0  . predniSONE (DELTASONE) 20 MG tablet    Sig: Take 2 tablets (40 mg total) by mouth daily with breakfast.    Dispense:  10 tablet    Refill:  0  . Spacer/Aero-Holding Chambers (AEROCHAMBER PLUS) inhaler    Sig: Use as instructed  Dispense:  1 each    Refill:  2  . fluticasone (FLONASE) 50 MCG/ACT nasal spray    Sig: Place 2 sprays into both nostrils daily.    Dispense:  16 g    Refill:  0    *This clinic note was created using Scientist, clinical (histocompatibility and immunogenetics). Therefore, there may be occasional mistakes despite careful proofreading.   ?    Domenick Gong, MD 10/12/17 [phone]

## 2018-02-12 ENCOUNTER — Emergency Department (HOSPITAL_COMMUNITY)
Admission: EM | Admit: 2018-02-12 | Discharge: 2018-02-12 | Discharge status: Against Medical Advice | Payer: Commercial Managed Care - PPO | Attending: Emergency Medicine | Admitting: Emergency Medicine

## 2018-02-12 ENCOUNTER — Encounter (HOSPITAL_COMMUNITY): Payer: Self-pay | Admitting: Emergency Medicine

## 2018-02-12 ENCOUNTER — Ambulatory Visit (HOSPITAL_COMMUNITY): Payer: Commercial Managed Care - PPO

## 2018-02-12 ENCOUNTER — Other Ambulatory Visit: Payer: Self-pay

## 2018-02-12 ENCOUNTER — Ambulatory Visit (HOSPITAL_COMMUNITY)
Admission: EM | Admit: 2018-02-12 | Discharge: 2018-02-12 | Disposition: A | Discharge status: Home / Self Care | Payer: Commercial Managed Care - PPO | Attending: Family Medicine | Admitting: Family Medicine

## 2018-02-12 DIAGNOSIS — Z5321 Procedure and treatment not carried out due to patient leaving prior to being seen by health care provider: Secondary | ICD-10-CM | POA: Diagnosis not present

## 2018-02-12 DIAGNOSIS — J40 Bronchitis, not specified as acute or chronic: Secondary | ICD-10-CM

## 2018-02-12 DIAGNOSIS — R221 Localized swelling, mass and lump, neck: Secondary | ICD-10-CM | POA: Diagnosis not present

## 2018-02-12 DIAGNOSIS — R05 Cough: Secondary | ICD-10-CM | POA: Diagnosis present

## 2018-02-12 MED ORDER — PREDNISOLONE 15 MG/5ML PO SYRP: 15.0000 mg | ORAL_SOLUTION | Freq: Two times a day (BID) | ORAL | 0 refills | Status: AC

## 2018-02-12 NOTE — ED Provider Notes (Signed)
[facility]   [id number] 02/12/18 Arrival Time: 1400   SUBJECTIVE:  Felicia Lawson is a 62 y.o. female who presents to the urgent care with complaint of cough, swollen lymph node, and sore throat at night.  Her symptoms began three days ago.  She noticed a left sided nodule on the left side of her larynx at the same time.   Past Medical History:  Diagnosis Date  . COPD (chronic obstructive pulmonary disease) (HCC)   . Trichomonas infection    Family History  Problem Relation Age of Onset  . Diabetes Mother   . Diabetes Sister   . Hypertension Sister    Social History   Socioeconomic History  . Marital status: Single    Spouse name: Not on file  . Number of children: Not on file  . Years of education: Not on file  . Highest education level: Not on file  Social Needs  . Financial resource strain: Not on file  . Food insecurity - worry: Not on file  . Food insecurity - inability: Not on file  . Transportation needs - medical: Not on file  . Transportation needs - non-medical: Not on file  Occupational History  . Not on file  Tobacco Use  . Smoking status: Current Every Day Smoker    Packs/day: 0.50  Substance and Sexual Activity  . Alcohol use: No  . Drug use: No  . Sexual activity: Yes    Birth control/protection: Post-menopausal, Surgical  Other Topics Concern  . Not on file  Social History Narrative  . Not on file   No outpatient medications have been marked as taking for the 02/12/18 encounter [facility] Encounter).   No Known Allergies    ROS: As per HPI, remainder of ROS negative.   OBJECTIVE:   Vitals:   02/12/18 1423 02/12/18 1424  BP:  133/63  Pulse:  72  Resp:  16  Temp:  98.5 F (36.9 C)  TempSrc:  Oral  SpO2:  97%  Weight: 112 lb (50.8 kg)      General appearance: alert; no distress Eyes: PERRL; EOMI; conjunctiva normal HENT: normocephalic; atraumatic; TMs normal, canal normal, external ears normal without trauma; nasal  mucosa normal; oral mucosa normal Neck: supple; 1/2 cm linear left laryngeal hard fixed nodule Lungs: decreased BS, expiratory wheezes. Heart: regular rate and rhythm Back: no CVA tenderness Extremities: no cyanosis or edema; symmetrical with no gross deformities Skin: warm and dry Neurologic: normal gait; grossly normal Psychological: alert and cooperative; normal mood and affect      Labs:  Results for orders placed or performed during the hospital encounter of 05/31/17  Basic metabolic panel  Result Value Ref Range   Sodium 138 135 - 145 mmol/L   Potassium 3.2 (L) 3.5 - 5.1 mmol/L   Chloride 105 101 - 111 mmol/L   CO2 25 22 - 32 mmol/L   Glucose, Bld 140 (H) 65 - 99 mg/dL   BUN 16 6 - 20 mg/dL   Creatinine, Ser 4.09 0.44 - 1.00 mg/dL   Calcium 8.9 8.9 - 81.1 mg/dL   GFR calc non Af Amer >60 >60 mL/min   GFR calc Af Amer >60 >60 mL/min   Anion gap 8 5 - 15  Lactic acid, plasma  Result Value Ref Range   Lactic Acid, Venous 1.7 0.5 - 1.9 mmol/L  Troponin I  Result Value Ref Range   Troponin I <0.03 <0.03 ng/mL  CBC with Differential  Result Value Ref Range  WBC 16.5 (H) 4.0 - 10.5 K/uL   RBC 4.54 3.87 - 5.11 MIL/uL   Hemoglobin 13.6 12.0 - 15.0 g/dL   HCT 16.139.1 09.636.0 - 04.546.0 %   MCV 86.1 78.0 - 100.0 fL   MCH 30.0 26.0 - 34.0 pg   MCHC 34.8 30.0 - 36.0 g/dL   RDW 40.913.3 81.111.5 - 91.415.5 %   Platelets 361 150 - 400 K/uL   Neutrophils Relative % 82 %   Neutro Abs 13.5 (H) 1.7 - 7.7 K/uL   Lymphocytes Relative 10 %   Lymphs Abs 1.7 0.7 - 4.0 K/uL   Monocytes Relative 7 %   Monocytes Absolute 1.2 (H) 0.1 - 1.0 K/uL   Eosinophils Relative 1 %   Eosinophils Absolute 0.1 0.0 - 0.7 K/uL   Basophils Relative 0 %   Basophils Absolute 0.0 0.0 - 0.1 K/uL  D-dimer, quantitative  Result Value Ref Range   D-Dimer, Quant 1.31 (H) 0.00 - 0.50 ug/mL-FEU  Lactic acid, plasma  Result Value Ref Range   Lactic Acid, Venous 1.6 0.5 - 1.9 mmol/L    Labs Reviewed - No data to  display  No results found.  Patient refused CXR   ASSESSMENT & PLAN:  1. Bronchitis   2. Neck nodule     Meds ordered this encounter  Medications  . prednisoLONE (PRELONE) 15 MG/5ML syrup    Sig: Take 5 mLs (15 mg total) by mouth 2 (two) times daily for 5 days.    Dispense:  60 mL    Refill:  0    Reviewed expectations re: course of current medical issues. Questions answered. Outlined signs and symptoms indicating need for more acute intervention. Patient verbalized understanding. After Visit Summary given.    Procedures:      Elvina SidleLauenstein, Uliana Brinker, MD 02/12/18 1505

## 2018-02-12 NOTE — ED Triage Notes (Signed)
Pt complaint of sore throat and cough for 3 days.

## 2018-02-12 NOTE — ED Triage Notes (Signed)
PT reports cough, swollen lymph node, and sore throat at night.

## 2018-06-03 ENCOUNTER — Emergency Department (HOSPITAL_COMMUNITY): Payer: Self-pay

## 2018-06-03 ENCOUNTER — Emergency Department (HOSPITAL_COMMUNITY)
Admission: EM | Admit: 2018-06-03 | Discharge: 2018-06-04 | Disposition: A | Discharge status: Home / Self Care | Payer: Self-pay | Attending: Emergency Medicine | Admitting: Emergency Medicine

## 2018-06-03 ENCOUNTER — Encounter (HOSPITAL_COMMUNITY): Payer: Self-pay | Admitting: Emergency Medicine

## 2018-06-03 ENCOUNTER — Other Ambulatory Visit: Payer: Self-pay

## 2018-06-03 DIAGNOSIS — Z79899 Other long term (current) drug therapy: Secondary | ICD-10-CM | POA: Insufficient documentation

## 2018-06-03 DIAGNOSIS — F17210 Nicotine dependence, cigarettes, uncomplicated: Secondary | ICD-10-CM | POA: Insufficient documentation

## 2018-06-03 DIAGNOSIS — J449 Chronic obstructive pulmonary disease, unspecified: Secondary | ICD-10-CM | POA: Insufficient documentation

## 2018-06-03 DIAGNOSIS — J189 Pneumonia, unspecified organism: Secondary | ICD-10-CM

## 2018-06-03 DIAGNOSIS — J181 Lobar pneumonia, unspecified organism: Secondary | ICD-10-CM | POA: Insufficient documentation

## 2018-06-03 LAB — CBC WITH DIFFERENTIAL/PLATELET
Abs Immature Granulocytes: 0.2 10*3/uL — ABNORMAL HIGH (ref 0.0–0.1)
Basophils Absolute: 0.1 10*3/uL (ref 0.0–0.1)
Basophils Relative: 0 %
EOS ABS: 0 10*3/uL (ref 0.0–0.7)
EOS PCT: 0 %
HEMATOCRIT: 36.7 % (ref 36.0–46.0)
Hemoglobin: 12.3 g/dL (ref 12.0–15.0)
Immature Granulocytes: 1 %
LYMPHS PCT: 10 %
Lymphs Abs: 2.1 10*3/uL (ref 0.7–4.0)
MCH: 29.5 pg (ref 26.0–34.0)
MCHC: 33.5 g/dL (ref 30.0–36.0)
MCV: 88 fL (ref 78.0–100.0)
MONO ABS: 1.1 10*3/uL — AB (ref 0.1–1.0)
Monocytes Relative: 5 %
NEUTROS PCT: 84 %
Neutro Abs: 18 10*3/uL — ABNORMAL HIGH (ref 1.7–7.7)
Platelets: 305 10*3/uL (ref 150–400)
RBC: 4.17 MIL/uL (ref 3.87–5.11)
RDW: 13.7 % (ref 11.5–15.5)
WBC: 21.5 10*3/uL — AB (ref 4.0–10.5)

## 2018-06-03 LAB — BASIC METABOLIC PANEL
ANION GAP: 10 (ref 5–15)
BUN: 19 mg/dL (ref 6–20)
CALCIUM: 8.8 mg/dL — AB (ref 8.9–10.3)
CO2: 25 mmol/L (ref 22–32)
Chloride: 101 mmol/L (ref 101–111)
Creatinine, Ser: 0.93 mg/dL (ref 0.44–1.00)
GFR calc Af Amer: >60 mL/min (ref >60)
GLUCOSE: 105 mg/dL — AB (ref 65–99)
POTASSIUM: 3.1 mmol/L — AB (ref 3.5–5.1)
SODIUM: 136 mmol/L (ref 135–145)

## 2018-06-03 NOTE — ED Triage Notes (Signed)
Patient to ED c/o generalized body aches. When first assessing patient, she c/o body aches all over since Friday with chills, loss of appetite, cough, headache, and some vomiting. She then changes her story and states the pain is only on her left side, from her L neck down to her L hip, and adds that she feels short of breath. Hx COPD. Resp e/u, skin warm/dry.

## 2018-06-03 NOTE — ED Provider Notes (Signed)
Patient placed in Quick Look pathway, seen and evaluated   Chief Complaint: generalized body aches,fever, chills, cough and congestion.  HPI:  Left side pain, cough and congestion and body aches. Nausea and vomiting, fever and chills. Patient reports she has vomited several times. Patient also reports loose stools. Cough is productive with brown sputum. Patient smokes daily.   ROS: General: fever and chills  Pulmonary: cough and congestion  GI: nausea and vomiting  Physical Exam:  BP 125/69   Pulse 86   Temp 99.4 F (37.4 C)   Resp 19   Ht 5\' 3"  (1.6 m)   Wt 52.2 kg (115 lb)   SpO2 96%   BMI 20.37 kg/m    Gen: No distress  Neuro: Awake and Alert  Skin: Warm and dry  Lungs: Rhonchi  Abdomen: soft, non tender     Initiation of care has begun. The patient has been counseled on the process, plan, and necessity for staying for the completion/evaluation, and the remainder of the medical screening examination    Janne Napoleoneese, Mitzi Lilja M, NP 06/03/18 2116    Melene PlanFloyd, Dan, DO 06/03/18 2321

## 2018-06-04 MED ORDER — ACETAMINOPHEN 500 MG PO TABS
1000.0000 mg | ORAL_TABLET | Freq: Once | ORAL | Status: AC
Start: 2018-06-04 — End: 2018-06-04
  Administered 2018-06-04: 1000 mg via ORAL
  Filled 2018-06-04: qty 2

## 2018-06-04 MED ORDER — ONDANSETRON 4 MG PO TBDP: 4.0000 mg | ORAL_TABLET | Freq: Three times a day (TID) | ORAL | 0 refills | Status: AC | PRN

## 2018-06-04 MED ORDER — ONDANSETRON HCL 4 MG PO TABS
4.0000 mg | ORAL_TABLET | Freq: Once | ORAL | Status: DC
Start: 2018-06-04 — End: 2018-06-04
  Filled 2018-06-04: qty 1

## 2018-06-04 MED ORDER — AMOXICILLIN-POT CLAVULANATE 875-125 MG PO TABS
2.0000 | ORAL_TABLET | Freq: Once | ORAL | Status: AC
  Administered 2018-06-04: 2 via ORAL
  Filled 2018-06-04: qty 2

## 2018-06-04 MED ORDER — AMOXICILLIN-POT CLAVULANATE 875-125 MG PO TABS: 2.0000 | ORAL_TABLET | Freq: Two times a day (BID) | ORAL | 0 refills | Status: AC

## 2018-06-04 MED ORDER — AMOXICILLIN-POT CLAVULANATE 875-125 MG PO TABS: 1.0000 | ORAL_TABLET | Freq: Once | ORAL | Status: DC

## 2018-06-04 NOTE — ED Provider Notes (Signed)
[facility]  CSN: [id number] Arrival date & time: 06/03/18 1944  Chief Complaint(s) Generalized Body Aches  HPI Felicia Lawson is a 62 y.o. female chronic smoker with a history of COPD and prior pneumonia presents to the emergency department with several days of left-sided body aches with associated chills, cough, headache, loss of appetite and nonbloody nonbilious emesis.  She endorses productive cough.  Also endorses mild shortness of breath.  Symptoms appear to worsen with coughing.  No alleviating factors.  States that she has a family member that had bronchitis recently.  No other known sick contacts.  No suspicious food intake.  No abdominal pain or diarrhea.  No chest pain.  Denies any other physical complaints.  HPI 3 days  Past Medical History Past Medical History:  Diagnosis Date  . COPD (chronic obstructive pulmonary disease) (HCC)   . Trichomonas infection    Patient Active Problem List   Diagnosis Date Noted  . CAP (community acquired pneumonia) 05/31/2017  . Abnormal x-ray 05/31/2017   Home Medication(s) Prior to Admission medications   Medication Sig Start Date End Date Taking? Authorizing Provider  amoxicillin-clavulanate (AUGMENTIN) 875-125 MG tablet Take 2 tablets by mouth every 12 (twelve) hours for 7 days. 06/04/18 06/11/18  Nira Connardama, Tylena Prisk Eduardo, MD  fluticasone (FLONASE) 50 MCG/ACT nasal spray Place 2 sprays into both nostrils daily. 10/11/17   Domenick GongMortenson, Ashley, MD  ondansetron (ZOFRAN ODT) 4 MG disintegrating tablet Take 1 tablet (4 mg total) by mouth every 8 (eight) hours as needed for up to 3 days for nausea or vomiting. 06/04/18 06/07/18  Devesh Monforte, Amadeo GarnetPedro Eduardo, MD  Spacer/Aero-Holding Chambers (AEROCHAMBER PLUS) inhaler Use as instructed 10/11/17   Domenick GongMortenson, Ashley, MD                                                                                                                                    Past  Surgical History Past Surgical History:  Procedure Laterality Date  . CESAREAN SECTION    . TUBAL LIGATION     Family History Family History  Problem Relation Age of Onset  . Diabetes Mother   . Diabetes Sister   . Hypertension Sister     Social History Social History   Tobacco Use  . Smoking status: Current Every Day Smoker    Packs/day: 0.50  . Smokeless tobacco: Never Used  Substance Use Topics  . Alcohol use: No  . Drug use: No   Allergies Patient has no known allergies.  Review of Systems Review of Systems All other systems are reviewed and are negative for acute change except as noted in the HPI  Physical Exam Vital Signs  I have reviewed the triage vital signs BP (!) 122/46   Pulse 96   Temp 99.9 F (37.7 C)   Resp 16   Ht 5\' 3"  (1.6 m)   Wt 52.2 kg (115 lb)  SpO2 97%   BMI 20.37 kg/m   Physical Exam  Constitutional: She is oriented to person, place, and time. She appears well-developed and well-nourished. No distress.  HENT:  Head: Normocephalic and atraumatic.  Nose: Nose normal.  Eyes: Pupils are equal, round, and reactive to light. Conjunctivae and EOM are normal. Right eye exhibits no discharge. Left eye exhibits no discharge. No scleral icterus.  Neck: Normal range of motion. Neck supple.  Cardiovascular: Normal rate and regular rhythm. Exam reveals no gallop and no friction rub.  No murmur heard. Pulmonary/Chest: Effort normal. No stridor. No respiratory distress. She has rales in the left middle field and the left lower field.  Abdominal: Soft. She exhibits no distension. There is no tenderness.  Musculoskeletal: She exhibits no edema or tenderness.  Neurological: She is alert and oriented to person, place, and time.  Skin: Skin is warm and dry. No rash noted. She is not diaphoretic. No erythema.  Psychiatric: She has a normal mood and affect.  Vitals reviewed.   ED Results and Treatments Labs (all labs ordered are listed, but only  abnormal results are displayed) Labs Reviewed  CBC WITH DIFFERENTIAL/PLATELET - Abnormal; Notable for the following components:      Result Value   WBC 21.5 (*)    Neutro Abs 18.0 (*)    Monocytes Absolute 1.1 (*)    Abs Immature Granulocytes 0.2 (*)    All other components within normal limits  BASIC METABOLIC PANEL - Abnormal; Notable for the following components:   Potassium 3.1 (*)    Glucose, Bld 105 (*)    Calcium 8.8 (*)    All other components within normal limits                                                                                                                         EKG  EKG Interpretation  Date/Time:    Ventricular Rate:    PR Interval:    QRS Duration:   QT Interval:    QTC Calculation:   R Axis:     Text Interpretation:        Radiology Dg Chest 2 View  Result Date: 06/03/2018 CLINICAL DATA:  62 year old female with productive cough and shortness of breath. EXAM: CHEST - 2 VIEW COMPARISON:  Chest radiograph dated 10/11/2017 and CT dated 05/31/2017 FINDINGS: There is emphysematous changes of the lungs with areas of subpleural scarring primarily involving the apical region. Left lower lobe airspace densities, new compared to the radiograph of 10/11/2017 most consistent with pneumonia. There is no pleural effusion or pneumothorax. The cardiac silhouette is within normal limits. No acute osseous pathology. IMPRESSION: 1. Left lower lobe infiltrate. 2. Emphysema and biapical subpleural scarring. Electronically Signed   By: Elgie Collard M.D.   On: 06/03/2018 21:59   Pertinent labs & imaging results that were available during my care of the patient were reviewed by me and considered in my medical decision making (see chart for details).  Medications Ordered in ED Medications  ondansetron (ZOFRAN) tablet 4 mg (4 mg Oral Not Given 06/04/18 0250)  acetaminophen (TYLENOL) tablet 1,000 mg (1,000 mg Oral Given 06/04/18 0200)  amoxicillin-clavulanate (AUGMENTIN)  875-125 MG per tablet 2 tablet (2 tablets Oral Given 06/04/18 0200)                                                                                                                                    Procedures Procedures  (including critical care time)  Medical Decision Making / ED Course I have reviewed the nursing notes for this encounter and the patient's prior records (if available in EHR or on provided paperwork).    Patient is afebrile with stable vital signs.  Work-up consistent with community-acquired pneumonia of the left lower lobe.  Patient is satting well on room air.  She is able to tolerate oral hydration without treatment.  Was provided with initial dose of oral antibiotic which she was able to tolerate.  Feel she is appropriate for outpatient management.  The patient appears reasonably screened and/or stabilized for discharge and I doubt any other medical condition or other [facility] requiring further screening, evaluation, or treatment in the ED at this time prior to discharge.  The patient is safe for discharge with strict return precautions.   Final Clinical Impression(s) / ED Diagnoses Final diagnoses:  Community acquired pneumonia of left lower lobe of lung (HCC)   Disposition: Discharge  Condition: Good  I have discussed the results, Dx and Tx plan with the patient who expressed understanding and agree(s) with the plan. Discharge instructions discussed at great length. The patient was given strict return precautions who verbalized understanding of the instructions. No further questions at time of discharge.    ED Discharge Orders        Ordered    amoxicillin-clavulanate (AUGMENTIN) 875-125 MG tablet  Every 12 hours     06/04/18 0244    ondansetron (ZOFRAN ODT) 4 MG disintegrating tablet  Every 8 hours PRN     06/04/18 0244       Follow Up: Primary care provider   If you do not have a primary care physician, contact [facility] at [phone] for  referral      This chart was dictated using voice recognition software.  Despite best efforts to proofread,  errors can occur which can change the documentation meaning.   Nira Conn, MD 06/04/18 [phone]

## 2018-06-29 IMAGING — CT CT ANGIO CHEST
2 of 6 series · 17 of 36 positions shown · IV contrast (ISOVUE 370)
Comparison: Chest radiograph May 31, 2017

CLINICAL DATA: Chest pain and fever

EXAM:
CT ANGIOGRAPHY CHEST WITH CONTRAST
TECHNIQUE: Multidetector CT imaging of the chest was performed using the
standard protocol during bolus administration of intravenous
contrast. Multiplanar CT image reconstructions and MIPs were
obtained to evaluate the vascular anatomy.
CONTRAST:  100 mL Isovue 370 nonionic

[Series 7: thins for pacs · axial · 0.67mm/px · z∈[-301,-36]mm · 16 of 295 slices shown]
[im 15/295  lung]
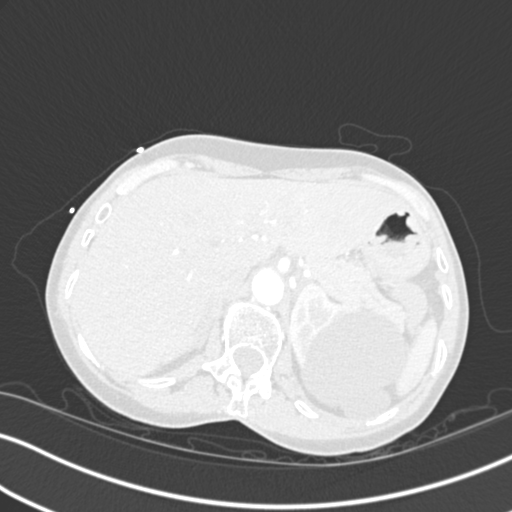
[im 30/295  mediastinal]
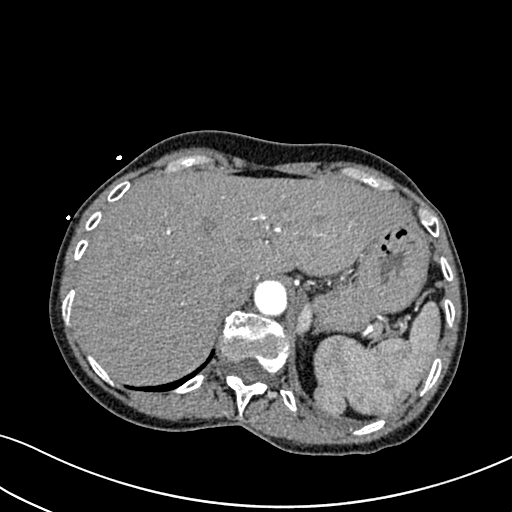
[im 45/295  lung]
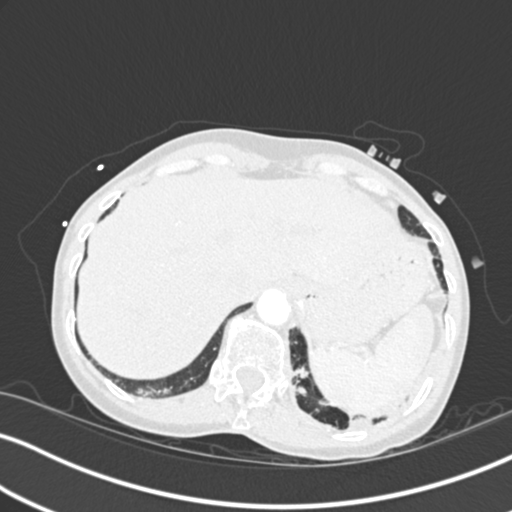
[im 74/295  mediastinal]
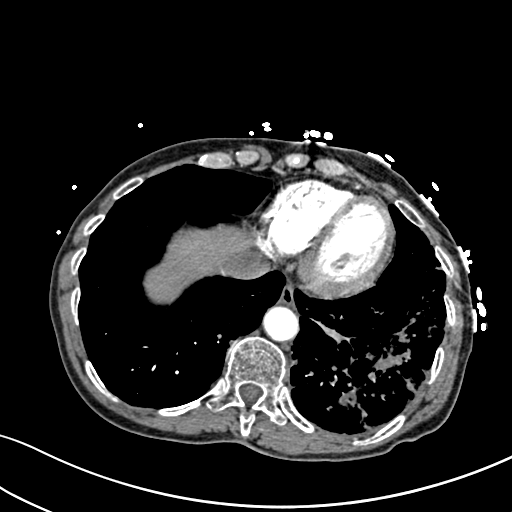
[im 89/295  lung]
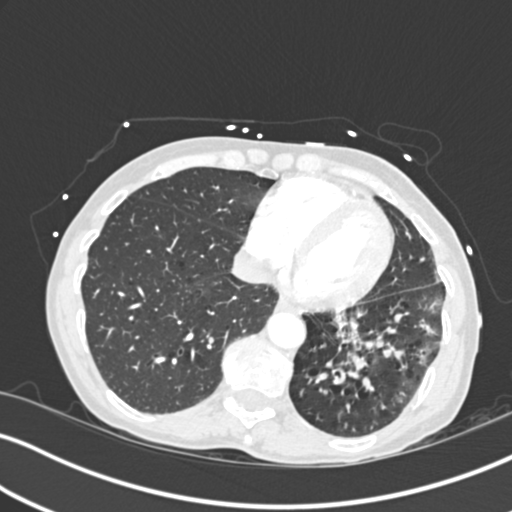
[im 103/295  mediastinal]
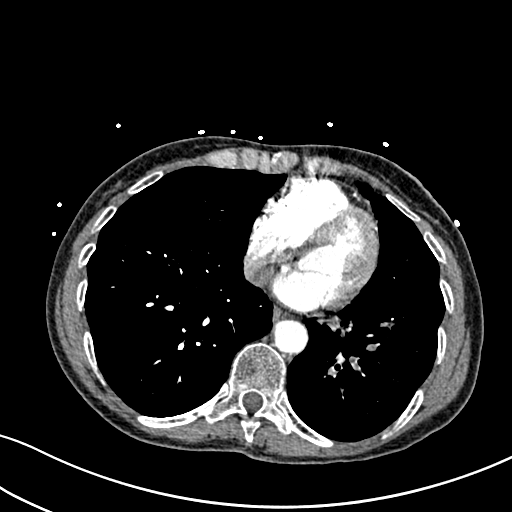
[im 118/295  lung]
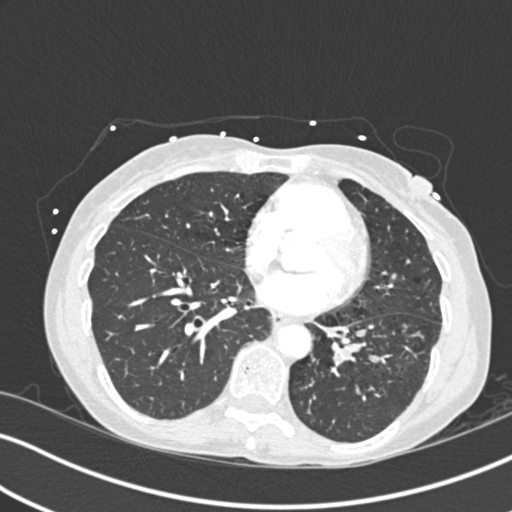
[im 133/295  mediastinal]
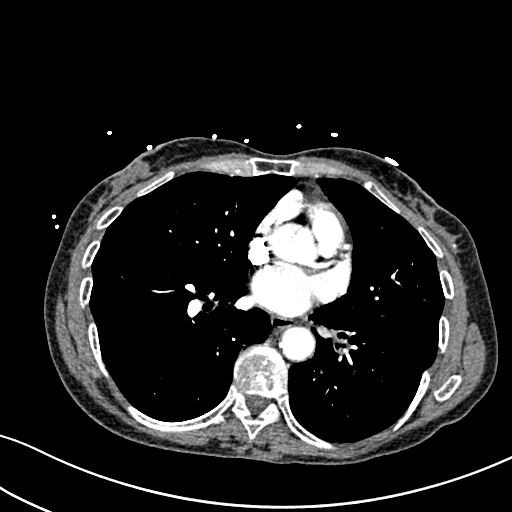
[im 162/295  lung]
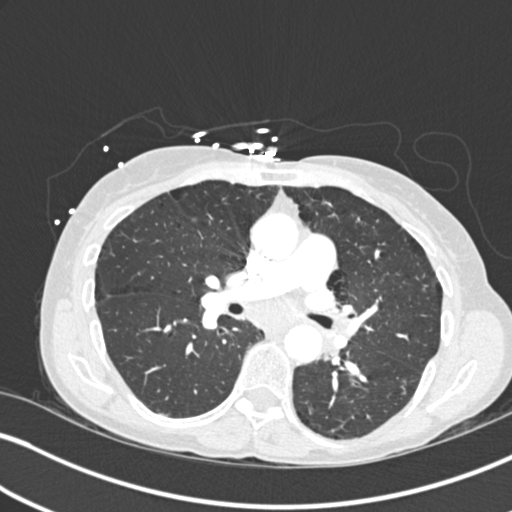
[im 177/295  mediastinal]
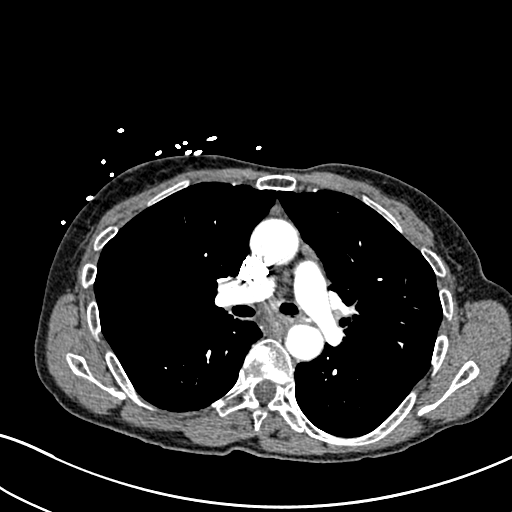
[im 192/295  lung]
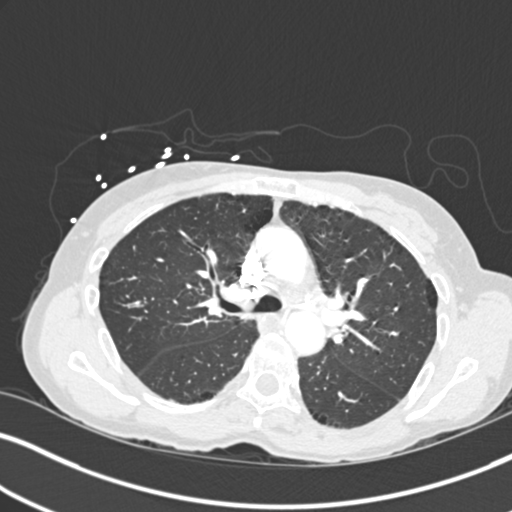
[im 206/295  mediastinal]
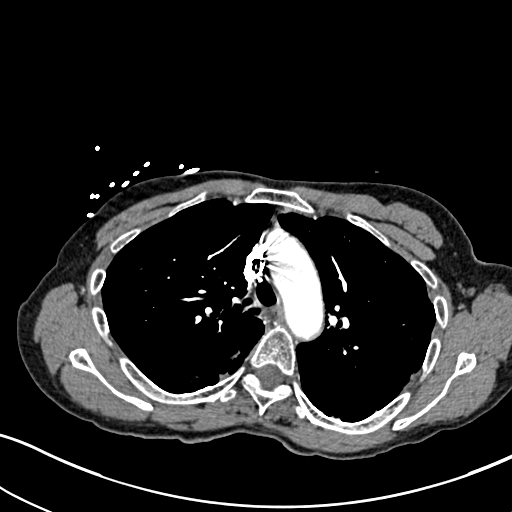
[im 221/295  lung]
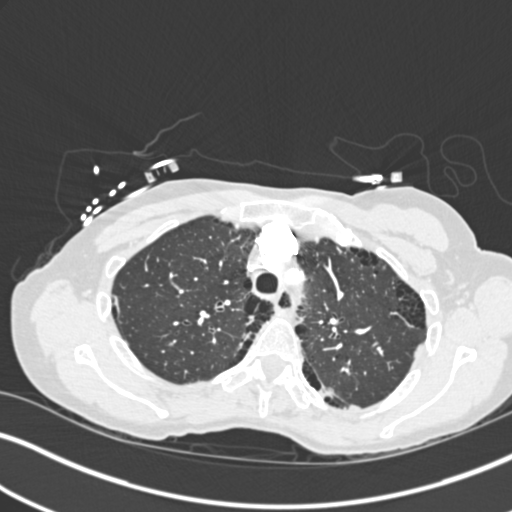
[im 250/295  mediastinal]
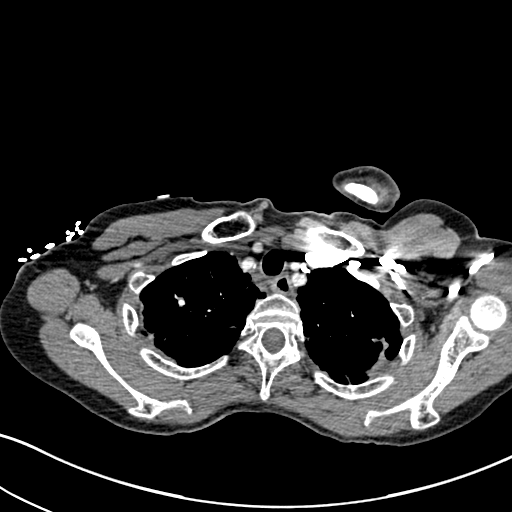
[im 265/295  lung]
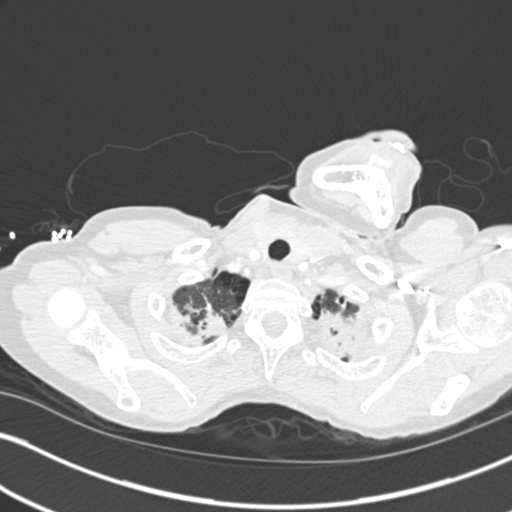
[im 280/295  mediastinal]
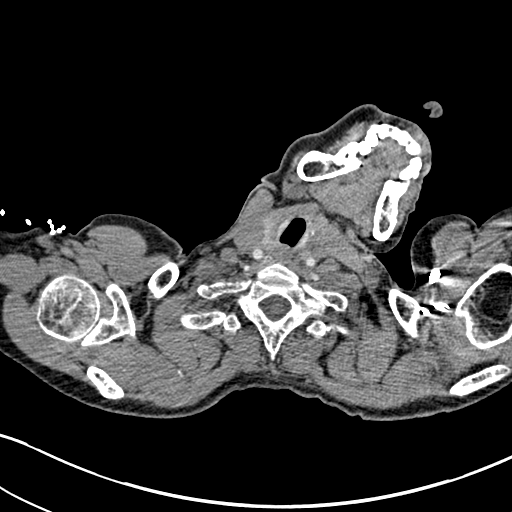

[Series 9: coronal mpr · coronal · 0.58mm/px · 1 of 147 slices shown]
[im 74/147  mediastinal]
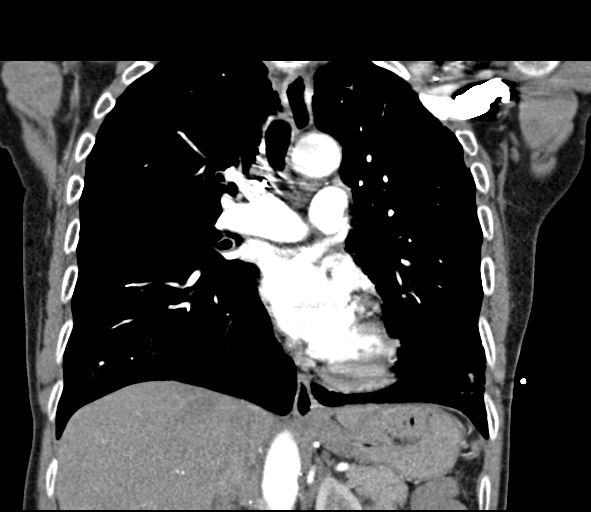

[17 of 36 positions shown; findings below may reference images not displayed]

FINDINGS: Cardiovascular: There is no appreciable pulmonary embolus. There is
no thoracic aortic aneurysm or dissection. The visualized great
vessels show moderate atherosclerotic calcification, most notably in
the proximal left subclavian artery. There are foci of
atherosclerotic calcification in the aorta. Pericardium is not
appreciably thickened. There is mild coronary artery calcification.

Mediastinum/Nodes: Thyroid appears unremarkable. There is adenopathy
in the sub- carinal region measuring 4.3 x 3.2 x 2.5 cm. There is a
lymph node just to the left of the upper carina measuring 1.5 x
x 1.4 cm. There is a lymph node in the left hilum measuring 1.5 x
1.0 x 1.0 cm.

Lungs/Pleura: There is underlying centrilobular emphysematous
change. There is scarring in each lung apex. A focal nodular opacity
in the apical segment of the right upper lobe measuring 1.1 x
cm, best appreciated on axial slice 15 series 8. There is airspace
consolidation throughout much of the left lower lobe with multiple
areas of left lower lobe bronchial wall are walking/obstruction with
airspace consolidation more distally to varying degrees in all
segments of note that there is a degree of lower lobe bronchiectatic
change. There is mild scarring in the lateral right base. On axial
slice 82 series 8, there is a 6 x 5 mm nodular opacity in the
posterior segment of the right lower lobe. There is no appreciable
pleural effusion or pleural thickening. The left lower lobe.

Upper Abdomen: There is incomplete visualization of a cyst arising
from the periphery of the left kidney measuring 7.7 x 7.0 cm. This
cyst appears lobulated and contains a few calcifications. This cyst
cannot be classified as a simple cyst. There is a probable accessory
spleen just posterior to the spleen abutting the left hemidiaphragm.
Visualized upper abdominal structures otherwise appear unremarkable.

Musculoskeletal: There are no blastic or lytic bone lesions.

Review of the MIP images confirms the above findings.
IMPRESSION: No evident pulmonary embolus.

There is adenopathy, most pronounced in the sub- carinal region.
This degree a adenopathy in the subcarinal region is concerning for
underlying neoplastic lesion.

Underlying centrilobular emphysema. Extensive peripheral left lower
lobe bronchiolar mucous plugging an obstruction with peripheral
pneumonia throughout much of the left lower lobe.

Extensive scarring in the lung apices. There is an irregular nodular
opacity in the apical segment right upper lobe measuring 1.1 x
cm. There is a 6 x 5 mm nodular opacity in the posterior right base.
It should be noted that additional nodular opacities easily be
obscured in the apices given the degree of scarring and mucosal
thickening present in these areas. Apical region pneumonia cannot be
excluded given this circumstance as well. Likewise, a pulmonary
neoplastic lesion could be obscured in the left lower lobe by the
degree of pneumonia.

Large mildly complex cyst arising from upper pole left kidney,
incompletely visualized.

It should be noted that most recent radiographic examination
significantly underestimates the amount of abnormality noted on
current CT. Given this circumstance, a repeat CT in 4-6 weeks to
assess for degree of clearing of pneumonia in the left lower lobe
after appropriate treatment may be warranted. With respect to
concern for neoplasm given the degree of sub- carinal adenopathy,
nuclear medicine PET-CT examination may wish to be considered to
further assess as well.

Aortic Atherosclerosis (S42CJ-PGU.U) and Emphysema (S42CJ-TTS.O).

## 2019-01-03 DIAGNOSIS — Z23 Encounter for immunization: Secondary | ICD-10-CM | POA: Diagnosis not present

## 2019-01-03 DIAGNOSIS — Z0000 Encounter for general adult medical examination without abnormal findings: Secondary | ICD-10-CM | POA: Diagnosis not present

## 2019-01-03 DIAGNOSIS — R5383 Other fatigue: Secondary | ICD-10-CM | POA: Diagnosis not present

## 2019-01-03 DIAGNOSIS — Z01419 Encounter for gynecological examination (general) (routine) without abnormal findings: Secondary | ICD-10-CM | POA: Diagnosis not present

## 2019-01-03 DIAGNOSIS — E119 Type 2 diabetes mellitus without complications: Secondary | ICD-10-CM | POA: Diagnosis not present

## 2019-01-03 DIAGNOSIS — E785 Hyperlipidemia, unspecified: Secondary | ICD-10-CM | POA: Diagnosis not present

## 2019-01-03 DIAGNOSIS — Z79899 Other long term (current) drug therapy: Secondary | ICD-10-CM | POA: Diagnosis not present

## 2019-01-03 DIAGNOSIS — E559 Vitamin D deficiency, unspecified: Secondary | ICD-10-CM | POA: Diagnosis not present

## 2019-01-03 DIAGNOSIS — H8113 Benign paroxysmal vertigo, bilateral: Secondary | ICD-10-CM | POA: Diagnosis not present

## 2019-01-03 DIAGNOSIS — R945 Abnormal results of liver function studies: Secondary | ICD-10-CM | POA: Diagnosis not present

## 2019-01-03 DIAGNOSIS — E039 Hypothyroidism, unspecified: Secondary | ICD-10-CM | POA: Diagnosis not present

## 2019-01-17 DIAGNOSIS — M79604 Pain in right leg: Secondary | ICD-10-CM | POA: Diagnosis not present

## 2019-01-17 DIAGNOSIS — R0989 Other specified symptoms and signs involving the circulatory and respiratory systems: Secondary | ICD-10-CM | POA: Diagnosis not present

## 2019-01-17 DIAGNOSIS — M79605 Pain in left leg: Secondary | ICD-10-CM | POA: Diagnosis not present

## 2019-01-17 DIAGNOSIS — Z122 Encounter for screening for malignant neoplasm of respiratory organs: Secondary | ICD-10-CM | POA: Diagnosis not present

## 2020-05-03 DIAGNOSIS — R5383 Other fatigue: Secondary | ICD-10-CM | POA: Diagnosis not present

## 2020-05-03 DIAGNOSIS — E559 Vitamin D deficiency, unspecified: Secondary | ICD-10-CM | POA: Diagnosis not present

## 2020-05-03 DIAGNOSIS — E7800 Pure hypercholesterolemia, unspecified: Secondary | ICD-10-CM | POA: Diagnosis not present

## 2020-05-03 DIAGNOSIS — Z0000 Encounter for general adult medical examination without abnormal findings: Secondary | ICD-10-CM | POA: Diagnosis not present

## 2020-05-03 DIAGNOSIS — Z79899 Other long term (current) drug therapy: Secondary | ICD-10-CM | POA: Diagnosis not present

## 2020-05-03 DIAGNOSIS — F17210 Nicotine dependence, cigarettes, uncomplicated: Secondary | ICD-10-CM | POA: Diagnosis not present

## 2020-05-03 DIAGNOSIS — Z1159 Encounter for screening for other viral diseases: Secondary | ICD-10-CM | POA: Diagnosis not present

## 2020-05-12 DIAGNOSIS — Z1211 Encounter for screening for malignant neoplasm of colon: Secondary | ICD-10-CM | POA: Diagnosis not present

## 2020-08-09 DIAGNOSIS — Z20822 Contact with and (suspected) exposure to covid-19: Secondary | ICD-10-CM | POA: Diagnosis not present

## 2020-11-02 DIAGNOSIS — Z03818 Encounter for observation for suspected exposure to other biological agents ruled out: Secondary | ICD-10-CM | POA: Diagnosis not present

## 2020-11-30 DIAGNOSIS — Z79899 Other long term (current) drug therapy: Secondary | ICD-10-CM | POA: Diagnosis not present

## 2020-11-30 DIAGNOSIS — E559 Vitamin D deficiency, unspecified: Secondary | ICD-10-CM | POA: Diagnosis not present

## 2020-11-30 DIAGNOSIS — R3915 Urgency of urination: Secondary | ICD-10-CM | POA: Diagnosis not present

## 2020-11-30 DIAGNOSIS — E7800 Pure hypercholesterolemia, unspecified: Secondary | ICD-10-CM | POA: Diagnosis not present

## 2021-08-22 ENCOUNTER — Encounter (HOSPITAL_COMMUNITY): Payer: Self-pay | Admitting: *Deleted

## 2021-08-22 ENCOUNTER — Ambulatory Visit (HOSPITAL_COMMUNITY)
Admission: EM | Admit: 2021-08-22 | Discharge: 2021-08-22 | Disposition: A | Discharge status: Home / Self Care | Payer: HMO | Attending: Emergency Medicine | Admitting: Emergency Medicine

## 2021-08-22 ENCOUNTER — Other Ambulatory Visit: Payer: Self-pay

## 2021-08-22 DIAGNOSIS — K047 Periapical abscess without sinus: Secondary | ICD-10-CM

## 2021-08-22 MED ORDER — AMOXICILLIN-POT CLAVULANATE 875-125 MG PO TABS: 1.0000 | ORAL_TABLET | Freq: Two times a day (BID) | ORAL | 0 refills | Status: DC

## 2021-08-22 NOTE — ED Provider Notes (Signed)
[facility]    CSN: [id number] Arrival date & time: 08/22/21  1805      History   Chief Complaint Chief Complaint  Patient presents with   dental abscess    HPI Felicia Lawson is a 65 y.o. female.   Patient here for evaluation of right sided facial swelling and pain that started on Friday.  Has not taken any OTC medications.  Patient does not have a dentist that she sees or follows up with regularly.  Denies any trauma, injury, or other precipitating event.  Denies any specific alleviating or aggravating factors.  Denies any fevers, chest pain, shortness of breath, N/V/D, numbness, tingling, weakness, abdominal pain, or headaches.    The history is provided by the patient.   Past Medical History:  Diagnosis Date   COPD (chronic obstructive pulmonary disease) (HCC)    Trichomonas infection     Patient Active Problem List   Diagnosis Date Noted   CAP (community acquired pneumonia) 05/31/2017   Abnormal x-ray 05/31/2017    Past Surgical History:  Procedure Laterality Date   CESAREAN SECTION     TUBAL LIGATION      OB History     Gravida  9   Para  8   Term  8   Preterm  0   AB  1   Living  6      SAB  0   IAB  1   Ectopic  0   Multiple  0   Live Births               Home Medications    Prior to Admission medications   Medication Sig Start Date End Date Taking? Authorizing Provider  amoxicillin-clavulanate (AUGMENTIN) 875-125 MG tablet Take 1 tablet by mouth every 12 (twelve) hours. 08/22/21  Yes Ivette Loyal, NP  fluticasone (FLONASE) 50 MCG/ACT nasal spray Place 2 sprays into both nostrils daily. 10/11/17   Domenick Gong, MD  Spacer/Aero-Holding Chambers (AEROCHAMBER PLUS) inhaler Use as instructed 10/11/17   Domenick Gong, MD    Family History Family History  Problem Relation Age of Onset   Diabetes Mother    Diabetes Sister    Hypertension Sister     Social History Social History   Tobacco Use   Smoking  status: Every Day    Packs/day: 0.50    Types: Cigarettes   Smokeless tobacco: Never  Vaping Use   Vaping Use: Never used  Substance Use Topics   Alcohol use: No   Drug use: No     Allergies   Patient has no known allergies.   Review of Systems Review of Systems  HENT:  Positive for dental problem.   All other systems reviewed and are negative.   Physical Exam Triage Vital Signs ED Triage Vitals  Enc Vitals Group     BP 08/22/21 1837 (!) 150/62     Pulse Rate 08/22/21 1837 83     Resp 08/22/21 1837 20     Temp 08/22/21 1837 99.5 F (37.5 C)     Temp src --      SpO2 08/22/21 1837 99 %     Weight --      Height --      Head Circumference --      Peak Flow --      Pain Score 08/22/21 1835 10     Pain Loc --      Pain Edu? --      Excl.  in [place]? --    No data found.  Updated Vital Signs BP (!) 150/62   Pulse 83   Temp 99.5 F (37.5 C)   Resp 20   SpO2 99%   Visual Acuity Right Eye Distance:   Left Eye Distance:   Bilateral Distance:    Right Eye Near:   Left Eye Near:    Bilateral Near:     Physical Exam Vitals and nursing note reviewed.  Constitutional:      General: She is not in acute distress.    Appearance: Normal appearance. She is not ill-appearing, toxic-appearing or diaphoretic.  HENT:     Head: Normocephalic and atraumatic.     Mouth/Throat:     Dentition: Abnormal dentition. Dental abscesses present.  Eyes:     Conjunctiva/sclera: Conjunctivae normal.  Cardiovascular:     Rate and Rhythm: Normal rate.     Pulses: Normal pulses.  Pulmonary:     Effort: Pulmonary effort is normal.  Abdominal:     General: Abdomen is flat.  Musculoskeletal:        General: Normal range of motion.     Cervical back: Normal range of motion.  Skin:    General: Skin is warm and dry.  Neurological:     General: No focal deficit present.     Mental Status: She is alert and oriented to person, place, and time.  Psychiatric:        Mood and Affect:  Mood normal.     UC Treatments / Results  Labs (all labs ordered are listed, but only abnormal results are displayed) Labs Reviewed - No data to display  EKG   Radiology No results found.  Procedures Procedures (including critical care time)  Medications Ordered in UC Medications - No data to display  Initial Impression / Assessment and Plan / UC Course  I have reviewed the triage vital signs and the nursing notes.  Pertinent labs & imaging results that were available during my care of the patient were reviewed by me and considered in my medical decision making (see chart for details).    Assessment negative for red flags or concerns.   Augmentin prescribed Recommend Ibuprofen and/or Tylenol as needed. Recommend soft diet until evaluated by dentist Maintain oral hygiene care Follow up with dentist as soon as possible for further evaluation and treatment  Return or go to the ED if you have any new or worsening symptoms such as fever, chills, difficulty swallowing, painful swallowing, oral or neck swelling, nausea, vomiting, chest pain, SOB.  Reviewed expectations re: course of current medical issues. Questions answered. Outlined signs and symptoms indicating need for more acute intervention. Patient verbalized understanding. After Visit Summary given. Final Clinical Impressions(s) / UC Diagnoses   Final diagnoses:  Dental abscess     Discharge Instructions      Take the augmentin one pill twice a day for the next 7 days.  You can take Ibuprofen and/or Tylenol as needed for pain relief and fever reduction.  Maintain oral hygiene care  Follow up with dentist as soon as possible for further evaluation and treatment  Return or go to the ED if you have any new or worsening symptoms such as fever, chills, difficulty swallowing, painful swallowing, oral or neck swelling, nausea, vomiting, chest pain, SOB.     ED Prescriptions     Medication Sig Dispense Auth.  Provider   amoxicillin-clavulanate (AUGMENTIN) 875-125 MG tablet Take 1 tablet by mouth every 12 (twelve) hours.  14 tablet Ivette Loyal, NP      PDMP not reviewed this encounter.   Ivette Loyal, NP 08/22/21 [phone]

## 2021-08-22 NOTE — Discharge Instructions (Signed)
Take the augmentin one pill twice a day for the next 7 days.  You can take Ibuprofen and/or Tylenol as needed for pain relief and fever reduction.  Maintain oral hygiene care  Follow up with dentist as soon as possible for further evaluation and treatment  Return or go to the ED if you have any new or worsening symptoms such as fever, chills, difficulty swallowing, painful swallowing, oral or neck swelling, nausea, vomiting, chest pain, SOB.

## 2021-08-22 NOTE — ED Triage Notes (Signed)
Pt reports dental abscess started on Friday. Swelling with redness to Rt side of face

## 2021-08-23 ENCOUNTER — Emergency Department (HOSPITAL_COMMUNITY)
Admission: EM | Admit: 2021-08-23 | Discharge: 2021-08-23 | Disposition: A | Discharge status: Home / Self Care | Payer: PPO

## 2021-09-12 DIAGNOSIS — Z20822 Contact with and (suspected) exposure to covid-19: Secondary | ICD-10-CM | POA: Diagnosis not present

## 2022-10-20 ENCOUNTER — Encounter (HOSPITAL_COMMUNITY): Payer: Self-pay

## 2022-10-20 ENCOUNTER — Ambulatory Visit (INDEPENDENT_AMBULATORY_CARE_PROVIDER_SITE_OTHER): Payer: PPO

## 2022-10-20 ENCOUNTER — Ambulatory Visit (HOSPITAL_COMMUNITY)
Admission: EM | Admit: 2022-10-20 | Discharge: 2022-10-20 | Disposition: A | Discharge status: Home / Self Care | Payer: PPO | Attending: Family Medicine | Admitting: Family Medicine

## 2022-10-20 DIAGNOSIS — J069 Acute upper respiratory infection, unspecified: Secondary | ICD-10-CM | POA: Diagnosis present

## 2022-10-20 DIAGNOSIS — R059 Cough, unspecified: Secondary | ICD-10-CM

## 2022-10-20 DIAGNOSIS — J189 Pneumonia, unspecified organism: Secondary | ICD-10-CM | POA: Insufficient documentation

## 2022-10-20 DIAGNOSIS — Z1152 Encounter for screening for COVID-19: Secondary | ICD-10-CM | POA: Insufficient documentation

## 2022-10-20 DIAGNOSIS — R6883 Chills (without fever): Secondary | ICD-10-CM

## 2022-10-20 MED ORDER — LEVOFLOXACIN 500 MG PO TABS: 500.0000 mg | ORAL_TABLET | Freq: Every day | ORAL | 0 refills | Status: DC

## 2022-10-20 NOTE — Discharge Instructions (Signed)
Chest x-ray showed a possible developing pneumonia on the left side.  Levaquin 500 mg--take 1 tablet daily for 5 days   You have been swabbed for COVID, and the test will result in the next 24 hours. Our staff will call you if positive. If the COVID test is positive, you should quarantine for 5 days from the start of your symptoms

## 2022-10-20 NOTE — ED Provider Notes (Addendum)
[facility]    CSN: [id number] Arrival date & time: 10/20/22  1408      History   Chief Complaint Chief Complaint  Patient presents with   Cough    HPI Felicia Lawson is a 66 y.o. female.    Cough  Here for cough and congestion and feeling chilled since November 6.  She has not documented a fever, but she has had some sweats.  The cough has been productive of a good bit of mucus.  She states she is "allergic to all medicines".  When I asked her what she means by that, she first states that she does not take any medicines.  She gives an example of 1 time when she took an antibiotic for pneumonia, "it made me cough up blood"    Past Medical History:  Diagnosis Date   COPD (chronic obstructive pulmonary disease) (Between)    Trichomonas infection     Patient Active Problem List   Diagnosis Date Noted   CAP (community acquired pneumonia) 05/31/2017   Abnormal x-ray 05/31/2017    Past Surgical History:  Procedure Laterality Date   CESAREAN SECTION     TUBAL LIGATION      OB History     Gravida  9   Para  8   Term  8   Preterm  0   AB  1   Living  6      SAB  0   IAB  1   Ectopic  0   Multiple  0   Live Births               Home Medications    Prior to Admission medications   Medication Sig Start Date End Date Taking? Authorizing Provider  levofloxacin (LEVAQUIN) 500 MG tablet Take 1 tablet (500 mg total) by mouth daily. 10/20/22  Yes Barrett Henle, MD    Family History Family History  Problem Relation Age of Onset   Diabetes Mother    Diabetes Sister    Hypertension Sister     Social History Social History   Tobacco Use   Smoking status: Every Day    Packs/day: 0.50    Types: Cigarettes   Smokeless tobacco: Never  Vaping Use   Vaping Use: Never used  Substance Use Topics   Alcohol use: No   Drug use: No     Allergies   Patient has no known allergies.   Review of Systems Review of Systems   Respiratory:  Positive for cough.      Physical Exam Triage Vital Signs ED Triage Vitals  Enc Vitals Group     BP 10/20/22 1427 136/61     Pulse Rate 10/20/22 1427 100     Resp 10/20/22 1427 16     Temp 10/20/22 1427 99.1 F (37.3 C)     Temp Source 10/20/22 1427 Oral     SpO2 10/20/22 1427 96 %     Weight --      Height --      Head Circumference --      Peak Flow --      Pain Score 10/20/22 1429 6     Pain Loc --      Pain Edu? --      Excl. in [place]? --    No data found.  Updated Vital Signs BP 136/61 (BP Location: Left Arm)   Pulse 100   Temp 99.1 F (37.3 C) (Oral)   Resp  16   SpO2 96%   Visual Acuity Right Eye Distance:   Left Eye Distance:   Bilateral Distance:    Right Eye Near:   Left Eye Near:    Bilateral Near:     Physical Exam Vitals reviewed.  Constitutional:      General: She is not in acute distress.    Appearance: She is not toxic-appearing.  HENT:     Right Ear: Tympanic membrane and ear canal normal.     Left Ear: Tympanic membrane and ear canal normal.     Nose: Nose normal.     Mouth/Throat:     Mouth: Mucous membranes are moist.     Comments: There is white and clear mucus in the oropharynx. Eyes:     Extraocular Movements: Extraocular movements intact.     Conjunctiva/sclera: Conjunctivae normal.     Pupils: Pupils are equal, round, and reactive to light.  Cardiovascular:     Rate and Rhythm: Normal rate and regular rhythm.     Heart sounds: No murmur heard. Pulmonary:     Effort: Pulmonary effort is normal. No respiratory distress.     Breath sounds: No stridor. No wheezing, rhonchi or rales.  Musculoskeletal:     Cervical back: Neck supple.  Lymphadenopathy:     Cervical: No cervical adenopathy.  Skin:    Capillary Refill: Capillary refill takes less than 2 seconds.     Coloration: Skin is not jaundiced or pale.  Neurological:     General: No focal deficit present.     Mental Status: She is alert and oriented to person,  place, and time.  Psychiatric:        Behavior: Behavior normal.      UC Treatments / Results  Labs (all labs ordered are listed, but only abnormal results are displayed) Labs Reviewed  SARS CORONAVIRUS 2 (TAT 6-24 HRS)    EKG   Radiology DG Chest 2 View  Result Date: 10/20/2022 CLINICAL DATA:  Productive cough, chills EXAM: CHEST - 2 VIEW COMPARISON:  06/03/2018 FINDINGS: Cardiac size is within normal limits. Increase in AP diameter of chest suggests COPD. Apical pleural thickening is noted on both sides, most likely scarring. Increased markings are seen in left lower lung field with slight improvement. Rest of the lung fields are clear. There is minimal blunting of left lateral CP angle. There is no pneumothorax. IMPRESSION: COPD. Infiltrate is seen in left lower lung field suggesting atelectasis/pneumonia. Part of this finding may be due to scarring. Similar finding was seen in previous study done on 06/03/2018. Possible minimal left pleural effusion. Pleural thickening is seen in both apices. Electronically Signed   By: Elmer Picker M.D.   On: 10/20/2022 15:25    Procedures Procedures (including critical care time)  Medications Ordered in UC Medications - No data to display  Initial Impression / Assessment and Plan / UC Course  I have reviewed the triage vital signs and the nursing notes.  Pertinent labs & imaging results that were available during my care of the patient were reviewed by me and considered in my medical decision making (see chart for details).        Chest x-ray shows a possible infiltrate on the left side.  She had a similar finding in 2019 but apparently it is slightly increased compared to the prior finding.  She does smoke, but has not had any wheezing and did not have any wheezing on exam.  COVID swab is done.  If  she is positive she is a candidate for Paxlovid.  Her EGFR in 2019 was over 60   antibiotics are sent for the possible  pneumonia Final Clinical Impressions(s) / UC Diagnoses   Final diagnoses:  Viral URI with cough  Community acquired pneumonia of left lower lobe of lung     Discharge Instructions      Chest x-ray showed a possible developing pneumonia on the left side.  Levaquin 500 mg--take 1 tablet daily for 5 days   You have been swabbed for COVID, and the test will result in the next 24 hours. Our staff will call you if positive. If the COVID test is positive, you should quarantine for 5 days from the start of your symptoms      ED Prescriptions     Medication Sig Dispense Auth. Provider   levofloxacin (LEVAQUIN) 500 MG tablet Take 1 tablet (500 mg total) by mouth daily. 5 tablet Camillo Quadros, Gwenlyn Perking, MD      PDMP not reviewed this encounter.   Barrett Henle, MD 10/20/22 1534    Barrett Henle, MD 10/20/22 1535

## 2022-10-20 NOTE — ED Triage Notes (Signed)
Patient with c/o chills, cough, and not feeling well.

## 2022-10-21 LAB — SARS CORONAVIRUS 2 (TAT 6-24 HRS): SARS Coronavirus 2: NEGATIVE

## 2022-11-23 ENCOUNTER — Other Ambulatory Visit: Payer: Self-pay

## 2022-11-23 ENCOUNTER — Ambulatory Visit (HOSPITAL_COMMUNITY)
Admission: EM | Admit: 2022-11-23 | Discharge: 2022-11-23 | Disposition: A | Discharge status: Home / Self Care | Payer: PPO | Attending: Family Medicine | Admitting: Family Medicine

## 2022-11-23 ENCOUNTER — Encounter (HOSPITAL_COMMUNITY): Payer: Self-pay | Admitting: Emergency Medicine

## 2022-11-23 ENCOUNTER — Emergency Department (HOSPITAL_COMMUNITY): Admission: EM | Admit: 2022-11-23 | Discharge: 2022-11-23 | Discharge status: Against Medical Advice | Payer: PPO

## 2022-11-23 DIAGNOSIS — M79602 Pain in left arm: Secondary | ICD-10-CM | POA: Diagnosis not present

## 2022-11-23 DIAGNOSIS — R079 Chest pain, unspecified: Secondary | ICD-10-CM

## 2022-11-23 NOTE — ED Provider Notes (Signed)
Patient seen briefly in triage she is having chest tightness that began last night and she is having left arm pain also.  It began while she was driving last night.  Apparently it is waxing and waning some.  No fever or chills or cough.  Her EKG is benign.  Her vital signs are reassuring.  I have asked her to please present to the emergency room for evaluation for the chest pain.  I have explained that we cannot do urgent evaluation and get stat labs back in a timely fashion in the urgent care clinic setting.   Zenia Resides, MD 11/23/22 1300

## 2022-11-23 NOTE — ED Notes (Signed)
Dr Marlinda Mike spoke to patient in intake room

## 2022-11-23 NOTE — ED Notes (Signed)
No answer when called for triage 

## 2022-11-23 NOTE — ED Notes (Signed)
Called x3 for triage no answer 

## 2022-11-23 NOTE — ED Notes (Signed)
Patient is being discharged from the Urgent Care and sent to the Emergency Department via pov . Per Dr Marlinda Mike, patient is in need of higher level of care due to Chest pain and limited resources. Patient is aware and verbalizes understanding of plan of care.  Vitals:   11/23/22 1244  BP: (!) 142/64  Pulse: 87  Resp: 20  Temp: 98 F (36.7 C)  SpO2: 97%

## 2022-11-23 NOTE — ED Triage Notes (Signed)
Tightness in chest.  Patient also complains of pain in left back.  The chest tightness for 3-4 weeks.  Left arm pain started yesterday.  Left back pain started today  Felt nausea yesterday and the day before.  Denies vomiting.

## 2022-12-19 ENCOUNTER — Ambulatory Visit (HOSPITAL_COMMUNITY)
Admission: EM | Admit: 2022-12-19 | Discharge: 2022-12-19 | Disposition: A | Discharge status: Home / Self Care | Payer: PPO | Attending: Physician Assistant | Admitting: Physician Assistant

## 2022-12-19 ENCOUNTER — Ambulatory Visit (INDEPENDENT_AMBULATORY_CARE_PROVIDER_SITE_OTHER): Payer: PPO

## 2022-12-19 ENCOUNTER — Encounter (HOSPITAL_COMMUNITY): Payer: Self-pay

## 2022-12-19 DIAGNOSIS — M1811 Unilateral primary osteoarthritis of first carpometacarpal joint, right hand: Secondary | ICD-10-CM

## 2022-12-19 DIAGNOSIS — M79644 Pain in right finger(s): Secondary | ICD-10-CM

## 2022-12-19 DIAGNOSIS — Z8709 Personal history of other diseases of the respiratory system: Secondary | ICD-10-CM

## 2022-12-19 MED ORDER — ALBUTEROL SULFATE HFA 108 (90 BASE) MCG/ACT IN AERS: 1.0000 | INHALATION_SPRAY | Freq: Four times a day (QID) | RESPIRATORY_TRACT | 0 refills | Status: DC | PRN

## 2022-12-19 MED ORDER — NAPROXEN 375 MG PO TABS: 375.0000 mg | ORAL_TABLET | Freq: Two times a day (BID) | ORAL | 0 refills | Status: DC

## 2022-12-19 NOTE — ED Provider Notes (Signed)
[facility]    CSN: [id number] Arrival date & time: 12/19/22  1012      History   Chief Complaint Chief Complaint  Patient presents with   thumb injury    HPI Felicia Lawson is a 67 y.o. female.   Patient presents today with 1 day history of injury involving her right thumb.  Reports that she was opening and managing a wheelchair when she felt a sudden pain.  She is unsure if she caught this in something or hit it but has had ongoing pain since that time.  Pain is rated 4 on a 0-10 pain scale, localized interphalangeal joint, described as aching, no aggravating relieving factors identified.  Does report decreased range of motion secondary to pain.  She has not tried any over-the-counter medication for symptom management.  She is left-handed.  Denies any numbness or paresthesias.    Past Medical History:  Diagnosis Date   COPD (chronic obstructive pulmonary disease) (HCC)    Trichomonas infection     Patient Active Problem List   Diagnosis Date Noted   CAP (community acquired pneumonia) 05/31/2017   Abnormal x-ray 05/31/2017    Past Surgical History:  Procedure Laterality Date   CESAREAN SECTION     TUBAL LIGATION      OB History     Gravida  9   Para  8   Term  8   Preterm  0   AB  1   Living  6      SAB  0   IAB  1   Ectopic  0   Multiple  0   Live Births               Home Medications    Prior to Admission medications   Medication Sig Start Date End Date Taking? Authorizing Provider  albuterol (VENTOLIN HFA) 108 (90 Base) MCG/ACT inhaler Inhale 1-2 puffs into the lungs every 6 (six) hours as needed for wheezing or shortness of breath. 12/19/22  Yes Bralynn Donado K, PA-C  naproxen (NAPROSYN) 375 MG tablet Take 1 tablet (375 mg total) by mouth 2 (two) times daily. 12/19/22  Yes Madalina Rosman, Noberto Retort, PA-C    Family History Family History  Problem Relation Age of Onset   Diabetes Mother    Diabetes Sister    Hypertension Sister      Social History Social History   Tobacco Use   Smoking status: Every Day    Packs/day: 0.50    Types: Cigarettes   Smokeless tobacco: Never  Vaping Use   Vaping Use: Never used  Substance Use Topics   Alcohol use: No   Drug use: No     Allergies   Patient has no known allergies.   Review of Systems Review of Systems  Constitutional:  Positive for activity change. Negative for appetite change, fatigue and fever.  Respiratory:  Positive for cough. Negative for shortness of breath.   Cardiovascular:  Negative for chest pain.  Musculoskeletal:  Positive for arthralgias and joint swelling. Negative for myalgias.  Neurological:  Negative for dizziness, light-headedness and headaches.     Physical Exam Triage Vital Signs ED Triage Vitals  Enc Vitals Group     BP 12/19/22 1136 (!) 156/70     Pulse Rate 12/19/22 1136 79     Resp 12/19/22 1136 16     Temp 12/19/22 1136 98.1 F (36.7 C)     Temp Source 12/19/22 1136 Oral  SpO2 12/19/22 1136 95 %     Weight --      Height --      Head Circumference --      Peak Flow --      Pain Score 12/19/22 1135 4     Pain Loc --      Pain Edu? --      Excl. in [place]? --    No data found.  Updated Vital Signs BP (!) 156/70 (BP Location: Left Arm)   Pulse 79   Temp 98.1 F (36.7 C) (Oral)   Resp 16   SpO2 95%   Visual Acuity Right Eye Distance:   Left Eye Distance:   Bilateral Distance:    Right Eye Near:   Left Eye Near:    Bilateral Near:     Physical Exam Vitals reviewed.  Constitutional:      General: She is awake. She is not in acute distress.    Appearance: Normal appearance. She is well-developed. She is not ill-appearing.     Comments: Very pleasant female present age in no acute distress sitting comfortably in exam room  HENT:     Head: Normocephalic and atraumatic.  Cardiovascular:     Rate and Rhythm: Normal rate and regular rhythm.     Heart sounds: Normal heart sounds, S1 normal and S2 normal. No  murmur heard. Pulmonary:     Effort: Pulmonary effort is normal.     Breath sounds: Wheezing present. No rhonchi or rales.     Comments: Scattered wheezing Abdominal:     Palpations: Abdomen is soft.     Tenderness: There is no abdominal tenderness.  Musculoskeletal:     Right hand: Bony tenderness present. Decreased range of motion. There is no disruption of two-point discrimination. Normal capillary refill.     Comments: Right thumb: Tenderness palpation over interphalangeal joint.  No deformity noted.  Decreased range of motion with flexion at this joint.  Hand neurovascularly intact.  Psychiatric:        Behavior: Behavior is cooperative.      UC Treatments / Results  Labs (all labs ordered are listed, but only abnormal results are displayed) Labs Reviewed - No data to display  EKG   Radiology No results found.  Procedures Procedures (including critical care time)  Medications Ordered in UC Medications - No data to display  Initial Impression / Assessment and Plan / UC Course  I have reviewed the triage vital signs and the nursing notes.  Pertinent labs & imaging results that were available during my care of the patient were reviewed by me and considered in my medical decision making (see chart for details).     On exam patient had mild wheezing.  She reports a history of asthma/COPD.  Does not have an albuterol inhaler available.  This was sent to pharmacy to have on hand for acute symptoms and we discussed how to properly use this medication.  X-ray was obtained given recent trauma and bony tenderness which showed no acute osseous abnormality but did show degenerative changes.  Discussed this with patient encouraged her to use heat and gentle stretch for symptom relief.  She was started on Naprosyn to help with pain.  Discussed that she should not take additional NSAIDs with this medication due to risk of GI bleeding.  Can use acetaminophen/Tylenol for symptom relief.   She was placed in a thumb spica DME brace to help with pain and provide stability.  Discussed that if  her symptoms worsen or change in any way she should be reevaluated.  She was given contact information for orthopedic provider if symptoms persist for reevaluation.  Strict return precautions given.  Work excuse note provided.  Final Clinical Impressions(s) / UC Diagnoses   Final diagnoses:  Degenerative arthritis of thumb, right  Thumb pain, right  History of COPD     Discharge Instructions      You have arthritis in your thumb.  Please use heat and gentle stretch to help with the pain.  Use the splint to provide some comfort and support.  Take Naprosyn twice a day.  Do not take NSAIDs with this medication including aspirin, ibuprofen/Advil, naproxen/Aleve.  If your symptoms or not improving or if anything worsens return for reevaluation.  I have called in albuterol inhaler to help manage her wheezing/shortness of breath associated with chronic lung disease.  If you are having to use this regularly if you develop any worsening symptoms please return for reevaluation.     ED Prescriptions     Medication Sig Dispense Auth. Provider   albuterol (VENTOLIN HFA) 108 (90 Base) MCG/ACT inhaler Inhale 1-2 puffs into the lungs every 6 (six) hours as needed for wheezing or shortness of breath. 18 g Rosaland Shiffman K, PA-C   naproxen (NAPROSYN) 375 MG tablet Take 1 tablet (375 mg total) by mouth 2 (two) times daily. 20 tablet Mirka Barbone, Noberto Retort, PA-C      PDMP not reviewed this encounter.   Jeani Hawking, PA-C 12/19/22 1338

## 2022-12-19 NOTE — Discharge Instructions (Signed)
You have arthritis in your thumb.  Please use heat and gentle stretch to help with the pain.  Use the splint to provide some comfort and support.  Take Naprosyn twice a day.  Do not take NSAIDs with this medication including aspirin, ibuprofen/Advil, naproxen/Aleve.  If your symptoms or not improving or if anything worsens return for reevaluation.  I have called in albuterol inhaler to help manage her wheezing/shortness of breath associated with chronic lung disease.  If you are having to use this regularly if you develop any worsening symptoms please return for reevaluation.

## 2022-12-19 NOTE — ED Triage Notes (Signed)
Pt is here for thumb injury  on right hand last night causing pain ,swelling,x 1day

## 2023-05-25 ENCOUNTER — Encounter (HOSPITAL_COMMUNITY): Payer: Self-pay

## 2023-05-25 ENCOUNTER — Ambulatory Visit (HOSPITAL_COMMUNITY)
Admission: EM | Admit: 2023-05-25 | Discharge: 2023-05-25 | Disposition: A | Discharge status: Home / Self Care | Payer: PPO | Attending: Internal Medicine | Admitting: Internal Medicine

## 2023-05-25 DIAGNOSIS — L03213 Periorbital cellulitis: Secondary | ICD-10-CM | POA: Diagnosis not present

## 2023-05-25 MED ORDER — OFLOXACIN 0.3 % OP SOLN: 1.0000 [drp] | Freq: Four times a day (QID) | OPHTHALMIC | 0 refills | Status: DC

## 2023-05-25 MED ORDER — AMOXICILLIN-POT CLAVULANATE 875-125 MG PO TABS: 1.0000 | ORAL_TABLET | Freq: Two times a day (BID) | ORAL | 0 refills | Status: DC

## 2023-05-25 NOTE — Discharge Instructions (Signed)
I am concerned that you have an infection around her eyelid.  Start Augmentin twice daily for 7 days.  Take this with food as it can upset your stomach.  Use ofloxacin drops.  Make sure to wash your hands before handling this medication and do not touch tip of medication bottle to the eye to prevent contamination of the medicine.  You can use warm compress a few times per day to help manage her symptoms.  I also recommend lubricating eyedrops/artificial tears for additional symptom relief.  If your symptoms or not improving within a few days please follow-up with ophthalmology; call to schedule an appointment.  If anything worsens you have increasing pain, increasing swelling, pain when you move your eyes, fever, nausea, vomiting you need to be seen immediately.

## 2023-05-25 NOTE — ED Triage Notes (Signed)
Pt c/o lt eye swelling and draining x2 days. States feels like something is in it. States was on the beach last week and my have sand in her eye. States needs a work note.

## 2023-05-25 NOTE — ED Provider Notes (Signed)
[facility]    CSN: [id number] Arrival date & time: 05/25/23  0849      History   Chief Complaint Chief Complaint  Patient presents with   Eye Drainage    HPI Felicia Lawson is a 67 y.o. female.   Patient presents today with a several day history of worsening left eye swelling and drainage.  She denies any ocular trauma but was at the beach.  She does not remember getting something in her eye but has had ongoing irritation and discomfort since then.  She denies any visual disturbance, fever, nausea, vomiting.  She has not tried any over-the-counter medication for symptom management.  She does not have an ophthalmologist and denies regular use of glasses or contacts.  She denies any recent antibiotic use.  She reports that the swelling is significantly worse symptoms and evaluation today.  Denies any associated foreign body sensation.    Past Medical History:  Diagnosis Date   COPD (chronic obstructive pulmonary disease) (HCC)    Trichomonas infection     Patient Active Problem List   Diagnosis Date Noted   CAP (community acquired pneumonia) 05/31/2017   Abnormal x-ray 05/31/2017    Past Surgical History:  Procedure Laterality Date   CESAREAN SECTION     TUBAL LIGATION      OB History     Gravida  9   Para  8   Term  8   Preterm  0   AB  1   Living  6      SAB  0   IAB  1   Ectopic  0   Multiple  0   Live Births               Home Medications    Prior to Admission medications   Medication Sig Start Date End Date Taking? Authorizing Provider  amoxicillin-clavulanate (AUGMENTIN) 875-125 MG tablet Take 1 tablet by mouth every 12 (twelve) hours. 05/25/23  Yes Kross Swallows K, PA-C  ofloxacin (OCUFLOX) 0.3 % ophthalmic solution Place 1 drop into the left eye 4 (four) times daily. 05/25/23  Yes Keylin Ferryman K, PA-C  albuterol (VENTOLIN HFA) 108 (90 Base) MCG/ACT inhaler Inhale 1-2 puffs into the lungs every 6 (six) hours as needed for  wheezing or shortness of breath. 12/19/22   Gean Larose, Noberto Retort, PA-C  naproxen (NAPROSYN) 375 MG tablet Take 1 tablet (375 mg total) by mouth 2 (two) times daily. 12/19/22   Adrianne Shackleton, Noberto Retort, PA-C    Family History Family History  Problem Relation Age of Onset   Diabetes Mother    Diabetes Sister    Hypertension Sister     Social History Social History   Tobacco Use   Smoking status: Every Day    Packs/day: .5    Types: Cigarettes   Smokeless tobacco: Never  Vaping Use   Vaping Use: Never used  Substance Use Topics   Alcohol use: No   Drug use: No     Allergies   Patient has no known allergies.   Review of Systems Review of Systems  Constitutional:  Positive for activity change. Negative for appetite change, fatigue and fever.  HENT:  Negative for congestion, sinus pressure, sneezing and sore throat.   Eyes:  Positive for discharge, redness and itching. Negative for photophobia, pain and visual disturbance.  Respiratory:  Negative for cough.   Gastrointestinal:  Negative for abdominal pain, diarrhea, nausea and vomiting.  Neurological:  Negative for dizziness, light-headedness  and headaches.     Physical Exam Triage Vital Signs ED Triage Vitals  Enc Vitals Group     BP 05/25/23 1009 (!) 145/67     Pulse Rate 05/25/23 1009 79     Resp 05/25/23 1009 18     Temp 05/25/23 1009 98.1 F (36.7 C)     Temp Source 05/25/23 1009 Oral     SpO2 05/25/23 1009 93 %     Weight --      Height --      Head Circumference --      Peak Flow --      Pain Score 05/25/23 1010 0     Pain Loc --      Pain Edu? --      Excl. in [place]? --    No data found.  Updated Vital Signs BP (!) 145/67 (BP Location: Left Arm)   Pulse 79   Temp 98.1 F (36.7 C) (Oral)   Resp 18   SpO2 93%   Visual Acuity Right Eye Distance: 20/50 Left Eye Distance: 20/50 Bilateral Distance: 20/30  Right Eye Near:   Left Eye Near:    Bilateral Near:     Physical Exam Vitals reviewed.  Constitutional:       General: She is awake. She is not in acute distress.    Appearance: Normal appearance. She is well-developed. She is not ill-appearing.     Comments: Very pleasant female appears stated age in no acute distress sitting comfortably in exam room  HENT:     Head: Normocephalic and atraumatic.  Eyes:     General:        Right eye: No discharge or hordeolum.        Left eye: Discharge and hordeolum present.    Extraocular Movements: Extraocular movements intact.     Conjunctiva/sclera: Conjunctivae normal.     Right eye: Right conjunctiva is not injected. No chemosis.    Left eye: Left conjunctiva is not injected. No chemosis.    Pupils: Pupils are equal, round, and reactive to light.     Comments: Mild erythema and edema left periorbital region.  No pain with extraocular movements.  Drainage noted in medial canthus.  Cardiovascular:     Rate and Rhythm: Normal rate and regular rhythm.     Heart sounds: Normal heart sounds, S1 normal and S2 normal. No murmur heard. Pulmonary:     Effort: Pulmonary effort is normal.     Breath sounds: Wheezing present. No rhonchi or rales.     Comments: Scattered wheezing Abdominal:     General: Bowel sounds are normal.     Palpations: Abdomen is soft.     Tenderness: There is no abdominal tenderness. There is no right CVA tenderness, left CVA tenderness, guarding or rebound.  Psychiatric:        Behavior: Behavior is cooperative.      UC Treatments / Results  Labs (all labs ordered are listed, but only abnormal results are displayed) Labs Reviewed - No data to display  EKG   Radiology No results found.  Procedures Procedures (including critical care time)  Medications Ordered in UC Medications - No data to display  Initial Impression / Assessment and Plan / UC Course  I have reviewed the triage vital signs and the nursing notes.  Pertinent labs & imaging results that were available during my care of the patient were reviewed by me  and considered in my medical decision making (see chart for  details).     Patient is well-appearing, afebrile, nontoxic, nontachycardic.  Vision is stable.  She denies any foreign body sensation or eye pain so fluorescein staining was deferred.  Concern for preseptal cellulitis given periorbital erythema and edema.  She was started on Augmentin twice daily for 7 days.  Also discussed ophthalmic antibiotic given associated drainage.  Initially offered erythromycin but she was concerned about using this medication due to potential side effects so instead was agreeable to ofloxacin drops.  She can use lubricating eyedrops/artificial tears for additional symptom relief.  Recommended close follow-up with her primary care.  Discussed that if her symptoms or not improving she should follow-up with ophthalmology was given contact information for local provider.  Strict return precautions given to which she expressed understanding including visual disturbance, fever, increasing swelling/pain, nausea, vomiting.  Work excuse note provided.  Final Clinical Impressions(s) / UC Diagnoses   Final diagnoses:  Preseptal cellulitis of left eye     Discharge Instructions      I am concerned that you have an infection around her eyelid.  Start Augmentin twice daily for 7 days.  Take this with food as it can upset your stomach.  Use ofloxacin drops.  Make sure to wash your hands before handling this medication and do not touch tip of medication bottle to the eye to prevent contamination of the medicine.  You can use warm compress a few times per day to help manage her symptoms.  I also recommend lubricating eyedrops/artificial tears for additional symptom relief.  If your symptoms or not improving within a few days please follow-up with ophthalmology; call to schedule an appointment.  If anything worsens you have increasing pain, increasing swelling, pain when you move your eyes, fever, nausea, vomiting you need to be  seen immediately.     ED Prescriptions     Medication Sig Dispense Auth. Provider   amoxicillin-clavulanate (AUGMENTIN) 875-125 MG tablet Take 1 tablet by mouth every 12 (twelve) hours. 14 tablet Tationa Stech K, PA-C   ofloxacin (OCUFLOX) 0.3 % ophthalmic solution Place 1 drop into the left eye 4 (four) times daily. 5 mL Lamira Borin K, PA-C      PDMP not reviewed this encounter.   Jeani Hawking, PA-C 05/25/23 1034

## 2023-09-12 ENCOUNTER — Ambulatory Visit (HOSPITAL_COMMUNITY)
Admission: EM | Admit: 2023-09-12 | Discharge: 2023-09-12 | Disposition: A | Discharge status: Home / Self Care | Payer: PPO | Attending: Emergency Medicine | Admitting: Emergency Medicine

## 2023-09-12 ENCOUNTER — Ambulatory Visit (INDEPENDENT_AMBULATORY_CARE_PROVIDER_SITE_OTHER): Payer: PPO

## 2023-09-12 ENCOUNTER — Encounter (HOSPITAL_COMMUNITY): Payer: Self-pay

## 2023-09-12 DIAGNOSIS — S20212A Contusion of left front wall of thorax, initial encounter: Secondary | ICD-10-CM

## 2023-09-12 MED ORDER — NAPROXEN 375 MG PO TABS: 375.0000 mg | ORAL_TABLET | Freq: Two times a day (BID) | ORAL | 0 refills | Status: AC

## 2023-09-12 MED ORDER — LIDOCAINE 4 % EX PTCH: 1.0000 | MEDICATED_PATCH | CUTANEOUS | 0 refills | Status: DC

## 2023-09-12 NOTE — ED Provider Notes (Signed)
[facility]    CSN: [id number] Arrival date & time: 09/12/23  0802      History   Chief Complaint Chief Complaint  Patient presents with   Breast Problem    HPI Felicia Lawson is a 67 y.o. female.   Patient presents to clinic for left-sided anterior rib cage pain.  On Sunday she was trying to get some close out of the washer, when she had to go partially into the washer to remove a stack pair of panties.  She hit her left rib cage up against the washer and immediately had pain.  She does have pain with deep inspiration and twisting and bending makes the pain worse.  She tried turmeric for pain and inflammation and has iced the area.  No bruising or deformity.  No falls or trauma. Denies cough, shortness of breath or wheezing. Denies rashes. No bruising.     The history is provided by the patient and medical records.    Past Medical History:  Diagnosis Date   COPD (chronic obstructive pulmonary disease) (HCC)    Trichomonas infection     Patient Active Problem List   Diagnosis Date Noted   CAP (community acquired pneumonia) 05/31/2017   Abnormal x-ray 05/31/2017    Past Surgical History:  Procedure Laterality Date   CESAREAN SECTION     TUBAL LIGATION      OB History     Gravida  9   Para  8   Term  8   Preterm  0   AB  1   Living  6      SAB  0   IAB  1   Ectopic  0   Multiple  0   Live Births               Home Medications    Prior to Admission medications   Medication Sig Start Date End Date Taking? Authorizing Provider  lidocaine (HM LIDOCAINE PATCH) 4 % Place 1 patch onto the skin daily. 09/12/23  Yes Rinaldo Ratel, [place] N, FNP  naproxen (NAPROSYN) 375 MG tablet Take 1 tablet (375 mg total) by mouth 2 (two) times daily. 09/12/23  Yes Claudie Rathbone, [place] N, FNP    Family History Family History  Problem Relation Age of Onset   Diabetes Mother    Diabetes Sister    Hypertension Sister     Social History Social  History   Tobacco Use   Smoking status: Every Day    Current packs/day: 0.50    Types: Cigarettes   Smokeless tobacco: Never  Vaping Use   Vaping status: Never Used  Substance Use Topics   Alcohol use: No   Drug use: No     Allergies   Patient has no known allergies.   Review of Systems Review of Systems  Respiratory:  Negative for cough, shortness of breath and wheezing.   Cardiovascular:  Positive for chest pain.     Physical Exam Triage Vital Signs ED Triage Vitals [09/12/23 0836]  Encounter Vitals Group     BP (!) 155/72     Systolic BP Percentile      Diastolic BP Percentile      Pulse Rate 80     Resp 16     Temp 98.4 F (36.9 C)     Temp Source Oral     SpO2 95 %     Weight      Height      Head Circumference  Peak Flow      Pain Score 8     Pain Loc      Pain Education      Exclude from Growth Chart    No data found.  Updated Vital Signs BP (!) 155/72 (BP Location: Left Arm)   Pulse 80   Temp 98.4 F (36.9 C) (Oral)   Resp 16   SpO2 95%   Visual Acuity Right Eye Distance:   Left Eye Distance:   Bilateral Distance:    Right Eye Near:   Left Eye Near:    Bilateral Near:     Physical Exam Vitals and nursing note reviewed.  Constitutional:      Appearance: Normal appearance.  HENT:     Head: Normocephalic and atraumatic.     Right Ear: External ear normal.     Left Ear: External ear normal.     Nose: Nose normal.     Mouth/Throat:     Mouth: Mucous membranes are moist.  Eyes:     Conjunctiva/sclera: Conjunctivae normal.  Cardiovascular:     Rate and Rhythm: Normal rate and regular rhythm.     Heart sounds: Normal heart sounds. No murmur heard. Pulmonary:     Effort: Pulmonary effort is normal. No respiratory distress.     Breath sounds: Wheezing present.  Chest:     Chest wall: Tenderness present. No deformity, swelling, crepitus or edema.       Comments: Anterior chest wall tenderness to palpation around 6 and seventh  rib cage.  No crepitus, bruising or deformity. Musculoskeletal:        General: Tenderness present. No swelling, deformity or signs of injury. Normal range of motion.  Skin:    General: Skin is warm and dry.  Neurological:     General: No focal deficit present.     Mental Status: She is alert and oriented to person, place, and time.  Psychiatric:        Mood and Affect: Mood normal.        Behavior: Behavior normal.      UC Treatments / Results  Labs (all labs ordered are listed, but only abnormal results are displayed) Labs Reviewed - No data to display  EKG   Radiology No results found.  Procedures Procedures (including critical care time)  Medications Ordered in UC Medications - No data to display  Initial Impression / Assessment and Plan / UC Course  I have reviewed the triage vital signs and the nursing notes.  Pertinent labs & imaging results that were available during my care of the patient were reviewed by me and considered in my medical decision making (see chart for details).  Vitals and triage reviewed, patient is hemodynamically stable.  Left anterior rib cage tenderness to palpation around 6th and 7th rib.  No obvious deformity, no crepitus or bruising.  Lungs with good air movement, minor expiratory wheezing bilaterally.  Imaging does not show any displaced fractures, awaiting official radiology overread.  Discussed anti-inflammatories and lidocaine patch for pain and discomfort.  Side effects of medications discussed.  Plan of care, follow-up care and return precautions given, no questions at this time.     Final Clinical Impressions(s) / UC Diagnoses   Final diagnoses:  Rib contusion, left, initial encounter     Discharge Instructions      Your imaging did not show any displaced fractures.  I believe you have bruised your rib cage, you can take the anti-inflammatories twice daily as needed  for pain.  You can also apply the topical lidocaine patch and  do heat or ice to the area.  Rib cage injuries can take weeks to heal, and will improve gradually.  Return to clinic or seek immediate care if you develop shortness of breath, fever, wheezing, or any new concerning symptoms.     ED Prescriptions     Medication Sig Dispense Auth. Provider   lidocaine (HM LIDOCAINE PATCH) 4 % Place 1 patch onto the skin daily. 15 patch Rinaldo Ratel, [place] N, [place]   naproxen (NAPROSYN) 375 MG tablet Take 1 tablet (375 mg total) by mouth 2 (two) times daily. 20 tablet Imo Cumbie, [place] N, [place]      PDMP not reviewed this encounter.   Rinaldo Ratel [place] N, [place] 09/12/23 [phone]

## 2023-09-12 NOTE — Discharge Instructions (Addendum)
Your imaging did not show any displaced fractures.  I believe you have bruised your rib cage, you can take the anti-inflammatories twice daily as needed for pain.  You can also apply the topical lidocaine patch and do heat or ice to the area.  Rib cage injuries can take weeks to heal, and will improve gradually.  Return to clinic or seek immediate care if you develop shortness of breath, fever, wheezing, or any new concerning symptoms.

## 2023-09-12 NOTE — ED Triage Notes (Signed)
Pt reports pain under her left breast after rubbing against the dryer. Pain 10/10

## 2023-10-22 ENCOUNTER — Ambulatory Visit (HOSPITAL_COMMUNITY)
Admission: EM | Admit: 2023-10-22 | Discharge: 2023-10-22 | Disposition: A | Discharge status: Home / Self Care | Payer: PPO

## 2023-10-23 ENCOUNTER — Other Ambulatory Visit: Payer: Self-pay

## 2023-10-23 ENCOUNTER — Ambulatory Visit (HOSPITAL_COMMUNITY)
Admission: EM | Admit: 2023-10-23 | Discharge: 2023-10-23 | Disposition: A | Discharge status: Home / Self Care | Payer: PPO | Attending: Emergency Medicine | Admitting: Emergency Medicine

## 2023-10-23 ENCOUNTER — Encounter (HOSPITAL_COMMUNITY): Payer: Self-pay | Admitting: Emergency Medicine

## 2023-10-23 DIAGNOSIS — K529 Noninfective gastroenteritis and colitis, unspecified: Secondary | ICD-10-CM

## 2023-10-23 MED ORDER — ONDANSETRON 4 MG PO TBDP: 4.0000 mg | ORAL_TABLET | Freq: Four times a day (QID) | ORAL | 0 refills | Status: AC | PRN

## 2023-10-23 MED ORDER — ONDANSETRON 4 MG PO TBDP: 4.0000 mg | ORAL_TABLET | Freq: Once | ORAL | Status: DC

## 2023-10-23 NOTE — ED Triage Notes (Signed)
Pt c/o generalized abd pain since the weekend with nausea, vomiting and diarrhea.

## 2023-10-23 NOTE — ED Provider Notes (Signed)
[facility]    CSN: [id number] Arrival date & time: 10/23/23  1029      History   Chief Complaint Chief Complaint  Patient presents with   Abdominal Pain    HPI Mabrey Quaranta is a 67 y.o. female.  2 day history of generalized abdominal cramping with nausea and diarrhea. Reporting constant nausea but nothing has come up yet. Small appetite. Has been tolerating fluids No fevers No urinary symptoms. Soft BM this morning Unknown sick contacts   Requesting a note for work  Past Medical History:  Diagnosis Date   COPD (chronic obstructive pulmonary disease) (HCC)    Trichomonas infection     Patient Active Problem List   Diagnosis Date Noted   CAP (community acquired pneumonia) 05/31/2017   Abnormal x-ray 05/31/2017    Past Surgical History:  Procedure Laterality Date   CESAREAN SECTION     TUBAL LIGATION      OB History     Gravida  9   Para  8   Term  8   Preterm  0   AB  1   Living  6      SAB  0   IAB  1   Ectopic  0   Multiple  0   Live Births               Home Medications    Prior to Admission medications   Medication Sig Start Date End Date Taking? Authorizing Provider  ondansetron (ZOFRAN-ODT) 4 MG disintegrating tablet Take 1 tablet (4 mg total) by mouth every 6 (six) hours as needed for nausea or vomiting. 10/23/23  Yes Jacole Capley, PA-C  lidocaine (HM LIDOCAINE PATCH) 4 % Place 1 patch onto the skin daily. 09/12/23   Garrison, [place] N, FNP  naproxen (NAPROSYN) 375 MG tablet Take 1 tablet (375 mg total) by mouth 2 (two) times daily. 09/12/23   Garrison, [place] N, FNP    Family History Family History  Problem Relation Age of Onset   Diabetes Mother    Diabetes Sister    Hypertension Sister     Social History Social History   Tobacco Use   Smoking status: Every Day    Current packs/day: 0.50    Types: Cigarettes   Smokeless tobacco: Never  Vaping Use   Vaping status: Never Used  Substance Use  Topics   Alcohol use: No   Drug use: No     Allergies   Patient has no known allergies.   Review of Systems Review of Systems Per HPI  Physical Exam Triage Vital Signs ED Triage Vitals [10/23/23 1114]  Encounter Vitals Group     BP (!) 154/79     Systolic BP Percentile      Diastolic BP Percentile      Pulse Rate 82     Resp 18     Temp 98.2 F (36.8 C)     Temp Source Oral     SpO2 98 %     Weight      Height      Head Circumference      Peak Flow      Pain Score 6     Pain Loc      Pain Education      Exclude from Growth Chart    No data found.  Updated Vital Signs BP (!) 154/79 (BP Location: Right Arm)   Pulse 82   Temp 98.2 F (36.8 C) (Oral)  Resp 18   SpO2 98%    Physical Exam Vitals and nursing note reviewed.  Constitutional:      Appearance: Normal appearance.  HENT:     Mouth/Throat:     Mouth: Mucous membranes are moist.     Pharynx: Oropharynx is clear.  Eyes:     Conjunctiva/sclera: Conjunctivae normal.  Cardiovascular:     Rate and Rhythm: Normal rate and regular rhythm.     Heart sounds: Normal heart sounds.  Pulmonary:     Effort: Pulmonary effort is normal. No respiratory distress.     Breath sounds: Normal breath sounds.  Abdominal:     General: Bowel sounds are normal.     Palpations: Abdomen is soft.     Tenderness: There is generalized abdominal tenderness. There is no right CVA tenderness, left CVA tenderness, guarding or rebound.  Musculoskeletal:        General: Normal range of motion.  Skin:    General: Skin is warm and dry.  Neurological:     Mental Status: She is alert and oriented to person, place, and time.     UC Treatments / Results  Labs (all labs ordered are listed, but only abnormal results are displayed) Labs Reviewed - No data to display  EKG  Radiology No results found.  Procedures Procedures (including critical care time)  Medications Ordered in UC Medications - No data to  display   Initial Impression / Assessment and Plan / UC Course  I have reviewed the triage vital signs and the nursing notes.  Pertinent labs & imaging results that were available during my care of the patient were reviewed by me and considered in my medical decision making (see chart for details).  Afebrile. Generalized discomfort, no point tenderness or guarding Discussed likely viral GI bug Patient declines zofran in clinic although she is complaining of nausea. Sent to pharmacy incase changes her mind. Discussed importance of fluids and hydrating. Return and ED precautions. Work note is provided  Final Clinical Impressions(s) / UC Diagnoses   Final diagnoses:  Gastroenteritis     Discharge Instructions      The zofran can be used every 6 hours as needed to settle the stomach Please drink a lot of fluids Bland diet if you have appetite You might have a few more days of symptoms Please go to the emergency department if symptoms worsen.     ED Prescriptions     Medication Sig Dispense Auth. Provider   ondansetron (ZOFRAN-ODT) 4 MG disintegrating tablet Take 1 tablet (4 mg total) by mouth every 6 (six) hours as needed for nausea or vomiting. 20 tablet Laranda Burkemper, Lurena Joiner, PA-C      PDMP not reviewed this encounter.   Shirl Ludington, Ray Church 10/23/23 1208

## 2023-10-23 NOTE — Discharge Instructions (Signed)
The zofran can be used every 6 hours as needed to settle the stomach Please drink a lot of fluids Bland diet if you have appetite You might have a few more days of symptoms Please go to the emergency department if symptoms worsen.

## 2024-01-16 ENCOUNTER — Ambulatory Visit (HOSPITAL_COMMUNITY)
Admission: EM | Admit: 2024-01-16 | Discharge: 2024-01-16 | Disposition: A | Discharge status: Home / Self Care | Payer: PPO | Attending: Emergency Medicine | Admitting: Emergency Medicine

## 2024-01-16 ENCOUNTER — Encounter (HOSPITAL_COMMUNITY): Payer: Self-pay

## 2024-01-16 DIAGNOSIS — J069 Acute upper respiratory infection, unspecified: Secondary | ICD-10-CM

## 2024-01-16 DIAGNOSIS — R197 Diarrhea, unspecified: Secondary | ICD-10-CM | POA: Diagnosis not present

## 2024-01-16 LAB — POCT INFLUENZA A/B
Influenza A, POC: NEGATIVE
Influenza B, POC: NEGATIVE

## 2024-01-16 MED ORDER — BENZONATATE 100 MG PO CAPS: 100.0000 mg | ORAL_CAPSULE | Freq: Three times a day (TID) | ORAL | 0 refills | Status: AC

## 2024-01-16 NOTE — ED Provider Notes (Signed)
 [facility]    CSN: [id number] Arrival date & time: 01/16/24  1128      History   Chief Complaint Chief Complaint  Patient presents with   Cough    HPI Felicia Lawson is a 68 y.o. female.   Patient presents to clinic for complaints of cough, chills, sore throat, nasal congestion, rhinorrhea, headache, body aches and fatigue for the past 2 days.  She has also had some diarrhea.  No nausea or vomiting.  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  Feels like she is short of breath, has not had any wheezing.  She has not tried any medications or interventions for her symptoms.  The history is provided by the patient and medical records.  Cough   Past Medical History:  Diagnosis Date   COPD (chronic obstructive pulmonary disease) (HCC)    Trichomonas infection     Patient Active Problem List   Diagnosis Date Noted   CAP (community acquired pneumonia) 05/31/2017   Abnormal x-ray 05/31/2017    Past Surgical History:  Procedure Laterality Date   CESAREAN SECTION     TUBAL LIGATION      OB History     Gravida  9   Para  8   Term  8   Preterm  0   AB  1   Living  6      SAB  0   IAB  1   Ectopic  0   Multiple  0   Live Births               Home Medications    Prior to Admission medications   Medication Sig Start Date End Date Taking? Authorizing Provider  benzonatate  (TESSALON ) 100 MG capsule Take 1 capsule (100 mg total) by mouth every 8 (eight) hours. 01/16/24  Yes Leniyah Martell  N, FNP  lidocaine  (HM LIDOCAINE  PATCH) 4 % Place 1 patch onto the skin daily. 09/12/23   Dreama, Hallel Denherder  N, FNP  naproxen  (NAPROSYN ) 375 MG tablet Take 1 tablet (375 mg total) by mouth 2 (two) times daily. 09/12/23   Dreama, Dontreal Miera  N, FNP  ondansetron  (ZOFRAN -ODT) 4 MG disintegrating tablet Take 1 tablet (4 mg total) by mouth every 6 (six) hours as needed for nausea or vomiting. 10/23/23   Rising, [place]berry, PA-C    Family History Family  History  Problem Relation Age of Onset   Diabetes Mother    Diabetes Sister    Hypertension Sister     Social History Social History   Tobacco Use   Smoking status: Every Day    Current packs/day: 0.50    Types: Cigarettes   Smokeless tobacco: Never  Vaping Use   Vaping status: Never Used  Substance Use Topics   Alcohol use: No   Drug use: No     Allergies   Patient has no known allergies.   Review of Systems Review of Systems  Per HPI   Physical Exam Triage Vital Signs ED Triage Vitals  Encounter Vitals Group     BP 01/16/24 1330 (!) 166/75     Systolic BP Percentile --      Diastolic BP Percentile --      Pulse Rate 01/16/24 1330 86     Resp 01/16/24 1330 16     Temp 01/16/24 1330 98.4 F (36.9 C)     Temp Source 01/16/24 1330 Oral     SpO2 01/16/24 1330 97 %     Weight --  Height --      Head Circumference --      Peak Flow --      Pain Score 01/16/24 1331 0     Pain Loc --      Pain Education --      Exclude from Growth Chart --    No data found.  Updated Vital Signs BP (!) 166/75 (BP Location: Left Arm)   Pulse 86   Temp 98.4 F (36.9 C) (Oral)   Resp 16   SpO2 97%   Visual Acuity Right Eye Distance:   Left Eye Distance:   Bilateral Distance:    Right Eye Near:   Left Eye Near:    Bilateral Near:     Physical Exam Vitals and nursing note reviewed.  Constitutional:      Appearance: Normal appearance.  HENT:     Head: Normocephalic and atraumatic.     Right Ear: External ear normal.     Left Ear: External ear normal.     Nose: Congestion and rhinorrhea present.     Mouth/Throat:     Mouth: Mucous membranes are moist.     Pharynx: Posterior oropharyngeal erythema present.  Eyes:     Conjunctiva/sclera: Conjunctivae normal.  Cardiovascular:     Rate and Rhythm: Normal rate and regular rhythm.     Heart sounds: Normal heart sounds. No murmur heard. Pulmonary:     Effort: Pulmonary effort is normal. No respiratory  distress.     Breath sounds: Normal breath sounds. No wheezing.  Musculoskeletal:        General: Normal range of motion.  Skin:    General: Skin is warm and dry.  Neurological:     General: No focal deficit present.     Mental Status: She is alert and oriented to person, place, and time.  Psychiatric:        Mood and Affect: Mood normal.        Behavior: Behavior normal.      UC Treatments / Results  Labs (all labs ordered are listed, but only abnormal results are displayed) Labs Reviewed  POCT INFLUENZA A/B    EKG   Radiology No results found.  Procedures Procedures (including critical care time)  Medications Ordered in UC Medications - No data to display  Initial Impression / Assessment and Plan / UC Course  I have reviewed the triage vital signs and the nursing notes.  Pertinent labs & imaging results that were available during my care of the patient were reviewed by me and considered in my medical decision making (see chart for details).  Vitals and triage reviewed, patient is hemodynamically stable.  Lungs are vesicular, heart with regular rate and rhythm.  Congestion, rhinorrhea and postnasal drip present on physical exam.  POC influenza testing negative, suspect other viral URI with cough.  Will send in Tessalon  for cough, viral symptomatic management discussed.  Work note provided.  Plan of care, follow-up care return precautions given, no questions at this time.    Final Clinical Impressions(s) / UC Diagnoses   Final diagnoses:  Viral URI with cough  Diarrhea, unspecified type     Discharge Instructions      Ensure you are staying well-hydrated.  You can take Tylenol  500 mg every 8 hours for any body aches, fever or fatigue.  1200 mg of Mucinex daily can help loosen up any secretions as well as sleeping with a humidifier.  You can use the cough medicine as needed.  Please follow a bland diet to help improve symptoms of diarrhea.  Your symptoms are viral  in nature and should improve over the next 5 to 7 days.  If no improvement or any changes please return to clinic for reevaluation.  Your blood pressure was elevated again today in clinic, it is important that you follow-up with a primary care provider for routine health care evaluation and further investigation into your blood pressure.      ED Prescriptions     Medication Sig Dispense Auth. Provider   benzonatate  (TESSALON ) 100 MG capsule Take 1 capsule (100 mg total) by mouth every 8 (eight) hours. 21 capsule Dreama, Ladan Vanderzanden  N, FNP      PDMP not reviewed this encounter.   Dreama Larina SAILOR, FNP 01/16/24 1431

## 2024-01-16 NOTE — ED Triage Notes (Signed)
 Pt states cough,chills and scratchy throat for the past 2 days.

## 2024-01-16 NOTE — Discharge Instructions (Addendum)
 Ensure you are staying well-hydrated.  You can take Tylenol  500 mg every 8 hours for any body aches, fever or fatigue.  1200 mg of Mucinex daily can help loosen up any secretions as well as sleeping with a humidifier.  You can use the cough medicine as needed.  Please follow a bland diet to help improve symptoms of diarrhea.  Your symptoms are viral in nature and should improve over the next 5 to 7 days.  If no improvement or any changes please return to clinic for reevaluation.  Your blood pressure was elevated again today in clinic, it is important that you follow-up with a primary care provider for routine health care evaluation and further investigation into your blood pressure.

## 2024-09-08 ENCOUNTER — Emergency Department (HOSPITAL_BASED_OUTPATIENT_CLINIC_OR_DEPARTMENT_OTHER)
Admission: EM | Admit: 2024-09-08 | Discharge: 2024-09-08 | Disposition: A | Discharge status: Home / Self Care | Payer: Worker's Compensation

## 2024-09-08 ENCOUNTER — Other Ambulatory Visit: Payer: Self-pay

## 2024-09-08 ENCOUNTER — Emergency Department (HOSPITAL_BASED_OUTPATIENT_CLINIC_OR_DEPARTMENT_OTHER): Payer: Worker's Compensation

## 2024-09-08 ENCOUNTER — Encounter (HOSPITAL_BASED_OUTPATIENT_CLINIC_OR_DEPARTMENT_OTHER): Payer: Self-pay

## 2024-09-08 DIAGNOSIS — Y92410 Unspecified street and highway as the place of occurrence of the external cause: Secondary | ICD-10-CM | POA: Insufficient documentation

## 2024-09-08 DIAGNOSIS — M545 Low back pain, unspecified: Secondary | ICD-10-CM | POA: Diagnosis present

## 2024-09-08 MED ORDER — LIDOCAINE 5 % EX PTCH: 1.0000 | MEDICATED_PATCH | CUTANEOUS | 0 refills | Status: AC

## 2024-09-08 MED ORDER — METHOCARBAMOL 500 MG PO TABS: 500.0000 mg | ORAL_TABLET | Freq: Two times a day (BID) | ORAL | 0 refills | Status: AC
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
## 2024-09-08 NOTE — Discharge Instructions (Signed)

## 2024-10-09 ENCOUNTER — Other Ambulatory Visit: Payer: Self-pay | Admitting: Internal Medicine

## 2024-10-09 DIAGNOSIS — Z1231 Encounter for screening mammogram for malignant neoplasm of breast: Secondary | ICD-10-CM

## 2024-10-16 ENCOUNTER — Ambulatory Visit
Admission: RE | Admit: 2024-10-16 | Discharge: 2024-10-16 | Disposition: A | Discharge status: Home / Self Care | Source: Ambulatory Visit | Attending: Internal Medicine | Admitting: Internal Medicine

## 2024-10-16 DIAGNOSIS — Z1231 Encounter for screening mammogram for malignant neoplasm of breast: Secondary | ICD-10-CM
